# Patient Record
Sex: Male | Born: 1975 | Race: White | Hispanic: No | Marital: Married | Smoking: Former smoker
Health system: Southern US, Community
[De-identification: ages and names within clinical notes are randomized; demographics above are authoritative.]

## PROBLEM LIST (undated history)

## (undated) DIAGNOSIS — J3081 Allergic rhinitis due to animal (cat) (dog) hair and dander: Secondary | ICD-10-CM

## (undated) DIAGNOSIS — Z8711 Personal history of peptic ulcer disease: Secondary | ICD-10-CM

## (undated) DIAGNOSIS — S62319A Displaced fracture of base of unspecified metacarpal bone, initial encounter for closed fracture: Secondary | ICD-10-CM

## (undated) DIAGNOSIS — L4050 Arthropathic psoriasis, unspecified: Secondary | ICD-10-CM

## (undated) DIAGNOSIS — Z8719 Personal history of other diseases of the digestive system: Secondary | ICD-10-CM

## (undated) DIAGNOSIS — M797 Fibromyalgia: Secondary | ICD-10-CM

## (undated) HISTORY — PX: ORIF METACARPAL FRACTURE: SUR940

---

## 2010-06-08 ENCOUNTER — Emergency Department (HOSPITAL_COMMUNITY): Admission: EM | Admit: 2010-06-08 | Discharge: 2010-06-08 | Payer: Self-pay | Admitting: Emergency Medicine

## 2010-08-01 ENCOUNTER — Emergency Department (HOSPITAL_COMMUNITY): Admission: EM | Admit: 2010-08-01 | Discharge: 2010-08-01 | Payer: Self-pay | Admitting: Emergency Medicine

## 2012-12-08 ENCOUNTER — Emergency Department: Payer: Self-pay | Admitting: Unknown Physician Specialty

## 2012-12-08 LAB — DRUG SCREEN, URINE
Barbiturates, Ur Screen: NEGATIVE (ref ?–200)
Cannabinoid 50 Ng, Ur ~~LOC~~: POSITIVE (ref ?–50)
Cocaine Metabolite,Ur ~~LOC~~: POSITIVE (ref ?–300)
Methadone, Ur Screen: NEGATIVE (ref ?–300)
Phencyclidine (PCP) Ur S: NEGATIVE (ref ?–25)
Tricyclic, Ur Screen: NEGATIVE (ref ?–1000)

## 2012-12-08 LAB — CBC
HGB: 14.8 g/dL (ref 13.0–18.0)
MCH: 31.6 pg (ref 26.0–34.0)
MCHC: 34.8 g/dL (ref 32.0–36.0)
MCV: 91 fL (ref 80–100)
RDW: 13 % (ref 11.5–14.5)

## 2012-12-08 LAB — COMPREHENSIVE METABOLIC PANEL
Albumin: 4 g/dL (ref 3.4–5.0)
BUN: 22 mg/dL — ABNORMAL HIGH (ref 7–18)
Calcium, Total: 9.1 mg/dL (ref 8.5–10.1)
Co2: 23 mmol/L (ref 21–32)
Creatinine: 1.21 mg/dL (ref 0.60–1.30)
EGFR (African American): >60
EGFR (Non-African Amer.): >60
Glucose: 89 mg/dL (ref 65–99)
Osmolality: 275 (ref 275–301)
Potassium: 4 mmol/L (ref 3.5–5.1)
SGOT(AST): 40 U/L — ABNORMAL HIGH (ref 15–37)
Sodium: 136 mmol/L (ref 136–145)
Total Protein: 7.7 g/dL (ref 6.4–8.2)

## 2012-12-08 LAB — ETHANOL: Ethanol: <3 mg/dL

## 2015-08-19 ENCOUNTER — Emergency Department (HOSPITAL_COMMUNITY)
Admission: EM | Admit: 2015-08-19 | Discharge: 2015-08-19 | Disposition: A | Discharge status: Home / Self Care | Payer: Commercial Managed Care - PPO | Attending: Emergency Medicine | Admitting: Emergency Medicine

## 2015-08-19 ENCOUNTER — Encounter (HOSPITAL_COMMUNITY): Payer: Self-pay | Admitting: *Deleted

## 2015-08-19 DIAGNOSIS — Z8739 Personal history of other diseases of the musculoskeletal system and connective tissue: Secondary | ICD-10-CM | POA: Insufficient documentation

## 2015-08-19 DIAGNOSIS — Z880 Allergy status to penicillin: Secondary | ICD-10-CM | POA: Diagnosis not present

## 2015-08-19 DIAGNOSIS — J45901 Unspecified asthma with (acute) exacerbation: Secondary | ICD-10-CM | POA: Diagnosis not present

## 2015-08-19 DIAGNOSIS — R062 Wheezing: Secondary | ICD-10-CM | POA: Diagnosis present

## 2015-08-19 HISTORY — DX: Fibromyalgia: M79.7

## 2015-08-19 MED ORDER — ALBUTEROL SULFATE (2.5 MG/3ML) 0.083% IN NEBU
5.0000 mg | INHALATION_SOLUTION | Freq: Once | RESPIRATORY_TRACT | Status: AC
  Administered 2015-08-19: 5 mg via RESPIRATORY_TRACT
  Filled 2015-08-19: qty 6

## 2015-08-19 MED ORDER — IPRATROPIUM BROMIDE 0.02 % IN SOLN
0.5000 mg | Freq: Once | RESPIRATORY_TRACT | Status: AC
  Administered 2015-08-19: 0.5 mg via RESPIRATORY_TRACT
  Filled 2015-08-19: qty 2.5

## 2015-08-19 MED ORDER — ALBUTEROL SULFATE HFA 108 (90 BASE) MCG/ACT IN AERS: 1.0000 | INHALATION_SPRAY | Freq: Four times a day (QID) | RESPIRATORY_TRACT | Status: DC | PRN

## 2015-08-19 MED ORDER — PREDNISONE 20 MG PO TABS: 40.0000 mg | ORAL_TABLET | Freq: Every day | ORAL | Status: DC

## 2015-08-19 MED ORDER — DEXAMETHASONE SODIUM PHOSPHATE 10 MG/ML IJ SOLN
10.0000 mg | Freq: Once | INTRAMUSCULAR | Status: AC
Start: 2015-08-19 — End: 2015-08-19
  Administered 2015-08-19: 10 mg via INTRAMUSCULAR
  Filled 2015-08-19: qty 1

## 2015-08-19 NOTE — ED Provider Notes (Signed)
History  This chart was scribed for non-physician practitioner, Arthor Captain, PA-C,working with Richardean Canal, MD, by Karle Plumber, ED Scribe. This patient was seen in room TR08C/TR08C and the patient's care was started at 9:38 AM.  Chief Complaint  Patient presents with  . Asthma  . Wheezing  . Shortness of Breath   The history is provided by the patient and medical records. No language interpreter was used.    HPI Comments:  Tim Jackson is a 39 y.o. obese male who presents to the Emergency Department complaining of an asthma exacerbation that started about one weeks ago. He reports associated wheezing and SOB. 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 He states he called EMS a few days ago and received a nebulizer treatment that helped at the time. He denies any past intubations for his asthma. He reports a short hospitalization 11 years ago for an asthma related illness. He denies fever, chills, nausea or vomiting.  Past Medical History  Diagnosis Date  . Asthma   . Fibromyalgia    Past Surgical History  Procedure Laterality Date  . Hand surgery     No family history on file. Social History  Substance Use Topics  . Smoking status: Never Smoker   . Smokeless tobacco: None  . Alcohol Use: No    Review of Systems A complete 10 system review of systems was obtained and all systems are negative except as noted in the HPI and PMH.   Allergies  Fish allergy and Penicillins  Home Medications   Prior to Admission medications   Not on File   Triage Vitals: BP 102/48 mmHg  Pulse 52  Temp(Src) 98.4 F (36.9 C) (Oral)  Resp 20  Ht  (1.854 m)  Wt 270 lb (122.471 kg)  BMI 35.63 kg/m2  SpO2 99% Physical Exam  Constitutional: He is oriented to person, place, and time. He appears well-developed and well-nourished.  HENT:  Head: Normocephalic and atraumatic.  Eyes: EOM are normal.  Neck: Normal  range of motion.  Cardiovascular: Normal rate.   Pulmonary/Chest: Effort normal. He has wheezes (minimal inspiratory wheezes in left upper lobe).  Musculoskeletal: Normal range of motion.  Neurological: He is alert and oriented to person, place, and time.  Skin: Skin is warm and dry.  Psychiatric: He has a normal mood and affect. His behavior is normal.  Nursing note and vitals reviewed.   ED Course  Procedures (including critical care time) DIAGNOSTIC STUDIES: Oxygen Saturation is 99% on RA, normal by my interpretation.   COORDINATION OF CARE: 9:42 AM- Will prescribe albuterol MDI and Prednisone dose pack. Pt verbalizes understanding and agrees to plan.  Medications  albuterol (PROVENTIL) (2.5 MG/3ML) 0.083% nebulizer solution 5 mg (5 mg Nebulization Given 08/19/15 0841)  ipratropium (ATROVENT) nebulizer solution 0.5 mg (0.5 mg Nebulization Given 08/19/15 0841)  dexamethasone (DECADRON) injection 10 mg (10 mg Intramuscular Given 08/19/15 0845)    MDM   Final diagnoses:  Asthma exacerbation    Patient ambulated in ED with O2 saturations maintained >90, no current signs of respiratory distress. Lung exam improved after nebulizer treatment. Prednisone given in the ED and pt will bd dc with 5 day burst. Pt states they are breathing at baseline. Pt has been instructed to continue using prescribed medications and to speak with PCP about today's exacerbation.    I personally performed the services described in this documentation, which was scribed  in my presence. The recorded information has been reviewed and is accurate.      Arthor Captain, PA-C 08/21/15 1635  Richardean Canal, MD 08/22/15 1556

## 2015-08-19 NOTE — ED Notes (Signed)
Patient with hx of asthma.  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  He received a neb treatment and felt better.  His sx have worsened again over the past 2 days.  Patient does not have an inhaler at home.  Patient denies fever.  Patient states he was able to sleep but woke with sob.

## 2015-08-19 NOTE — ED Notes (Signed)
Oximetry maintained at 96-98% while ambulating on room air.

## 2015-08-19 NOTE — Discharge Instructions (Signed)

## 2015-08-23 ENCOUNTER — Telehealth: Payer: Self-pay | Admitting: Internal Medicine

## 2015-08-23 NOTE — Telephone Encounter (Signed)
Call to patient to confirm appointment for 08/24/15 at 2:15 lmtcb

## 2015-08-24 ENCOUNTER — Ambulatory Visit (INDEPENDENT_AMBULATORY_CARE_PROVIDER_SITE_OTHER): Payer: Commercial Managed Care - PPO | Admitting: Internal Medicine

## 2015-08-24 ENCOUNTER — Ambulatory Visit: Payer: Commercial Managed Care - PPO | Admitting: Internal Medicine

## 2015-08-24 ENCOUNTER — Encounter: Payer: Self-pay | Admitting: Internal Medicine

## 2015-08-24 VITALS — BP 129/75 | HR 86 | Temp 98.3°F | Ht 73.0 in | Wt 280.4 lb

## 2015-08-24 DIAGNOSIS — J45901 Unspecified asthma with (acute) exacerbation: Secondary | ICD-10-CM

## 2015-08-24 DIAGNOSIS — R21 Rash and other nonspecific skin eruption: Secondary | ICD-10-CM | POA: Diagnosis not present

## 2015-08-24 DIAGNOSIS — M797 Fibromyalgia: Secondary | ICD-10-CM

## 2015-08-24 DIAGNOSIS — Z0000 Encounter for general adult medical examination without abnormal findings: Secondary | ICD-10-CM

## 2015-08-24 DIAGNOSIS — R197 Diarrhea, unspecified: Secondary | ICD-10-CM

## 2015-08-24 DIAGNOSIS — R350 Frequency of micturition: Secondary | ICD-10-CM | POA: Diagnosis not present

## 2015-08-24 DIAGNOSIS — L409 Psoriasis, unspecified: Secondary | ICD-10-CM | POA: Insufficient documentation

## 2015-08-24 DIAGNOSIS — J4521 Mild intermittent asthma with (acute) exacerbation: Secondary | ICD-10-CM

## 2015-08-24 DIAGNOSIS — M2550 Pain in unspecified joint: Secondary | ICD-10-CM | POA: Insufficient documentation

## 2015-08-24 DIAGNOSIS — J4540 Moderate persistent asthma, uncomplicated: Secondary | ICD-10-CM | POA: Insufficient documentation

## 2015-08-24 MED ORDER — DULOXETINE HCL 30 MG PO CPEP: 30.0000 mg | ORAL_CAPSULE | Freq: Every day | ORAL | Status: DC

## 2015-08-24 NOTE — Assessment & Plan Note (Signed)
Patient has complaint of chronic diarrhea for about 7 years now. He describes loose, water stools that are rarely solid. He associates the diarrhea with meals, but denies association with dairy products. He describes previous dark, tarry stools about 5 months ago when diagnosed with PUD per symptoms. He was started on trial of PPI, but did not take as his symptoms resolved. He was told to avoid NSAIDs and has done so. He denies abdominal pain, hematochezia, or current melena. Denies recent travel, camping/hiking near water sources, or sick contacts. On exam, bowel sounds are normal, abdomen non-tender, no guarding or rebound.   His symptoms possibly due to IBS, have to also consider lactose intolerance, celiac's, parasite/ova, or immunocompromised state. Due to history of melena, may need upper endoscopy for further evaluation.  -CBC -CMP -HIV antibody -Referral to GI, Dr. Ewing Schlein was requested by patient and wife -Patient advised that he can try probiotics

## 2015-08-24 NOTE — Assessment & Plan Note (Signed)
Patient describes urinary frequency that keeps him from sleeping well at night, having to get up 3-4 times nightly and throughout the day. He endorses urgency, occasional dysuria/burning, occasional difficulty initiating flow, and cloudy color to the urine. He denies any hematuria. He drinks fluids throughout the day. He denies fever, chills, abdominal or flank pain, or suprapubic tenderness.  Will evaluate for possible UTI -UA w/ reflex microscopy

## 2015-08-24 NOTE — Patient Instructions (Signed)
It was a pleasure to meet you Mr. Tim Jackson.  We will check a few blood tests today as well as a urine test.  For your fibromyalgia, I have sent a prescription of Cymbalta (duloxetine) to your pharmacy. It is a 30 mg tablet you can take everyday with breakfast. It may take a few weeks for you to notice any effect, and we can slowly increase the dose as needed.  I have sent a referral to GI Dr. Ewing Schlein as well as to dermatology.  You may try probiotics for your diarrhea.  Please follow up with [place] in 2-4 weeks.

## 2015-08-24 NOTE — Progress Notes (Signed)
Patient ID: Tim Jackson, male   DOB: 01-31-1976, 39 y.o.   MRN: [id number]   Subjective:   Patient ID: Tim Jackson male   DOB: 03-18-1976 39 y.o.   MRN: [id number]  HPI: [name] is a 39 y.o. male with PMH of asthma and fibromyalgia who presents as a new patient to clinic for establishment of care and current complaints of urinary frequency, diarrhea, and muscle aches and pain. He is accompanied by his wife who provides additional history.  Please see problem based charting for details about chronic medical issues.  Past Medical History  Diagnosis Date  . Asthma   . Fibromyalgia    Current Outpatient Prescriptions  Medication Sig Dispense Refill  . albuterol (PROVENTIL HFA;VENTOLIN HFA) 108 (90 BASE) MCG/ACT inhaler Inhale 1-2 puffs into the lungs every 6 (six) hours as needed for wheezing or shortness of breath. 1 Inhaler 0  . predniSONE (DELTASONE) 20 MG tablet Take 2 tablets (40 mg total) by mouth daily. 10 tablet 0  . DULoxetine (CYMBALTA) 30 MG capsule Take 1 capsule (30 mg total) by mouth daily with breakfast. 30 capsule 0   No current facility-administered medications for this visit.   Family History  Problem Relation Age of Onset  . Cancer Mother   . Heart disease Father   . Cancer Maternal Grandfather   . Cancer Paternal Grandfather    Social History   Social History  . Marital Status: Divorced    Spouse Name: N/A  . Number of Children: N/A  . Years of Education: N/A   Social History Main Topics  . Smoking status: Former Smoker -- 0.50 packs/day for 2 years  . Smokeless tobacco: None  . Alcohol Use: No  . Drug Use: No  . Sexual Activity: Not Asked   Other Topics Concern  . None   Social History Narrative   Review of Systems: Review of Systems  Constitutional: Positive for malaise/fatigue. Negative for fever, chills, weight loss and diaphoresis.  HENT: Negative for sore throat.   Respiratory: Negative for cough, shortness of breath, wheezing and  stridor.   Cardiovascular: Negative for chest pain and palpitations.  Gastrointestinal: Positive for diarrhea. Negative for heartburn, nausea, vomiting, abdominal pain, constipation and blood in stool.       Previous melena, not currently.  Genitourinary: Positive for dysuria, urgency and frequency. Negative for hematuria and flank pain.  Musculoskeletal: Positive for myalgias. Negative for back pain, joint pain, falls and neck pain.  Skin:       Umbilical rash with occasional itching.  Neurological: Positive for tingling and weakness. Negative for dizziness, sensory change and headaches.    Objective:  Physical Exam: Filed Vitals:   08/24/15 1414  BP: 129/75  Pulse: 86  Temp: 98.3 F (36.8 C)  TempSrc: Oral  Height:  (1.854 m)  Weight: 280 lb 6.4 oz (127.189 kg)  SpO2: 95%   Physical Exam  Constitutional: He is oriented to person, place, and time. He appears well-developed and well-nourished.  HENT:  Head: Normocephalic and atraumatic.  Mouth/Throat: Oropharynx is clear and moist.  Eyes: EOM are normal. Pupils are equal, round, and reactive to light.  Neck: Neck supple.  Cardiovascular: Normal rate, regular rhythm and normal heart sounds.  Exam reveals no friction rub.   No murmur heard. Pulmonary/Chest: Effort normal and breath sounds normal. No respiratory distress. He has no wheezes. He has no rales.  Abdominal: Soft. Bowel sounds are normal. There is no tenderness.  Musculoskeletal: Normal range of  motion. He exhibits no edema.  Various spots of tenderness to palpation of muscles, 5/18 points of tenderness on fibromyalgia exam.  Neurological: He is alert and oriented to person, place, and time. He has normal strength. He displays normal reflexes. No sensory deficit.  Skin:  Erythematous/pink blanching umbilical rash that circles the navel up to 1.5 cm outwards. Dry, nonbleeding, no oozing or pus. Nontender.    Assessment & Plan:  Please see problem based  charting.

## 2015-08-24 NOTE — Assessment & Plan Note (Signed)
Patient recently seen in ED on 08/19/2015 for acute asthma exacerbation. 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 Patient states that this is his first exacerbation in at least 10 years and has not had to use his albuterol inhaler for that timespan. He has used his inhaler once since leaving the ED, but has no current complaints of wheezing, SOB, or DOE. Lungs are CTA on exam.  Had PFTs in 03/2013 with FEV1 of 61% and FEV1/FVC of 71%.  -Continue Albuterol inhaler prn

## 2015-08-24 NOTE — Assessment & Plan Note (Signed)
Patient with rash around the umbilicus that has been present for 12-13 years. It is not painful, but occasionally pruritic. It is erythematous/pink in color, blanching, maculopapular and located in the umbilicus with borders extending 1.5 cm outwards in circular fashion around the navel. It is non-tender to touch, dry, non-bleeding, no oozing or pus. He reports trying antifungal and steroid creams in the past with no improvement. Does not associate rash with clothes, soaps, detergents, or lotions.  -Will refer to dermatology

## 2015-08-24 NOTE — Assessment & Plan Note (Signed)
Refused influenza vaccine.

## 2015-08-24 NOTE — Progress Notes (Signed)
39 year old male diagnosed with fibromyalgia from before (Care Everywhere), on prednisone currently for asthma exacerbation visits this clinic for the first time. Current concerns:  Fibromyalgia - Amitriptyline tried in the past caused shortness of breath. We will start the patient on low dose Cymbalta. Revisit in 4 weeks.  Rash around umbilicus - Chronic, patient has tried all different kinds of antifungal and steroids. Refer to dermatology for biopsy.  Chronic diarrhea, only during daytime, associated with food intake, likely IBS. Also history of UGIB in April, no black tarry stools now. No symptoms now, does not want PPI currently. Refer to GI for further work up.  Does not want flu shot.  Case seen and examined with Dr Terrilee Croak. Plan discussed.  Aletta Edouard MD MPH 08/24/2015 3:55 PM

## 2015-08-24 NOTE — Assessment & Plan Note (Addendum)
Patient reports diagnosis of fibromyalgia at [facility] about 3 years ago. He has symptoms of muscle aches that are dull in sensation and described as heavy, tight feelings with occasional spasms and "locked-up" muscles. His symptoms are worse during the day and when he is resting after work. He describes feeling fatigued and needing to force himself to get up and get his daily activities done. He also reports loss of muscle mass even though he is physically active, especially at work. Denies joint pain, fevers, chills, nausea, vomiting, or constipation. He does endorse chronic diarrhea. He has several spots of tenderness to palpation (5/18 for fibromyalgia exam), ROM, strength, and DTRs are intact.  I reviewed most of workup done at [facility] with only results that stood out as abnormal of positive ANA, slightly elevated Aldolase 11.2, and borderline normal B12 of 300. ESR was 4 and CRP/CK levels were WNL as well. He reports trying both Amitriptyline and Gabapentin before, neither of which he could tolerate due to difficulty breathing and hypotension/feeling unwell, respectively. It is possible he has fibromyalgia, have to consider other myopathies/myositis as well.  -Will try Duloxetine (cymbalta) 30 mg po daily with breakfast. Patient advised it may take several weeks to notice effects. -f/u in 2-4 weeks

## 2015-08-25 LAB — CBC
HEMOGLOBIN: 14.8 g/dL (ref 12.6–17.7)
Hematocrit: 44.7 % (ref 37.5–51.0)
MCH: 30.1 pg (ref 26.6–33.0)
MCHC: 33.1 g/dL (ref 31.5–35.7)
MCV: 91 fL (ref 79–97)
Platelets: 348 10*3/uL (ref 150–379)
RBC: 4.92 x10E6/uL (ref 4.14–5.80)
RDW: 13.6 % (ref 12.3–15.4)
WBC: 10.3 10*3/uL (ref 3.4–10.8)

## 2015-08-25 LAB — CMP14 + ANION GAP
A/G RATIO: 1.7 (ref 1.1–2.5)
ALT: 29 IU/L (ref 0–44)
AST: 20 IU/L (ref 0–40)
Albumin: 4.5 g/dL (ref 3.5–5.5)
Alkaline Phosphatase: 92 IU/L (ref 39–117)
Anion Gap: 18 mmol/L (ref 10.0–18.0)
BILIRUBIN TOTAL: 0.9 mg/dL (ref 0.0–1.2)
BUN/Creatinine Ratio: 17 (ref 8–19)
BUN: 20 mg/dL (ref 6–20)
CHLORIDE: 101 mmol/L (ref 97–108)
CO2: 23 mmol/L (ref 18–29)
Calcium: 10 mg/dL (ref 8.7–10.2)
Creatinine, Ser: 1.21 mg/dL (ref 0.76–1.27)
GFR calc non Af Amer: 75 mL/min/{1.73_m2} (ref >59)
GFR, EST AFRICAN AMERICAN: 87 mL/min/{1.73_m2} (ref >59)
GLUCOSE: 138 mg/dL — AB (ref 65–99)
Globulin, Total: 2.7 g/dL (ref 1.5–4.5)
POTASSIUM: 5.3 mmol/L — AB (ref 3.5–5.2)
Sodium: 142 mmol/L (ref 134–144)
TOTAL PROTEIN: 7.2 g/dL (ref 6.0–8.5)

## 2015-08-25 LAB — HIV ANTIBODY (ROUTINE TESTING W REFLEX): HIV SCREEN 4TH GENERATION: NONREACTIVE

## 2015-08-25 LAB — URINALYSIS, ROUTINE W REFLEX MICROSCOPIC
Bilirubin, UA: NEGATIVE
GLUCOSE, UA: NEGATIVE
KETONES UA: NEGATIVE
LEUKOCYTES UA: NEGATIVE
NITRITE UA: NEGATIVE
Protein, UA: NEGATIVE
RBC, UA: NEGATIVE
SPEC GRAV UA: 1.025 (ref 1.005–1.030)
Urobilinogen, Ur: 0.2 mg/dL (ref 0.2–1.0)
pH, UA: 5.5 (ref 5.0–7.5)

## 2015-09-25 ENCOUNTER — Ambulatory Visit: Payer: Commercial Managed Care - PPO | Admitting: Internal Medicine

## 2015-10-02 ENCOUNTER — Ambulatory Visit (INDEPENDENT_AMBULATORY_CARE_PROVIDER_SITE_OTHER): Payer: Commercial Managed Care - PPO | Admitting: Internal Medicine

## 2015-10-02 ENCOUNTER — Encounter: Payer: Self-pay | Admitting: Internal Medicine

## 2015-10-02 VITALS — BP 132/78 | HR 100 | Temp 98.0°F | Ht 73.0 in | Wt 280.8 lb

## 2015-10-02 DIAGNOSIS — R197 Diarrhea, unspecified: Secondary | ICD-10-CM

## 2015-10-02 DIAGNOSIS — M791 Myalgia, unspecified site: Secondary | ICD-10-CM

## 2015-10-02 DIAGNOSIS — R21 Rash and other nonspecific skin eruption: Secondary | ICD-10-CM

## 2015-10-02 DIAGNOSIS — M797 Fibromyalgia: Secondary | ICD-10-CM

## 2015-10-02 MED ORDER — DULOXETINE HCL 60 MG PO CPEP: 60.0000 mg | ORAL_CAPSULE | Freq: Every day | ORAL | Status: DC

## 2015-10-02 MED ORDER — PANTOPRAZOLE SODIUM 40 MG PO TBEC: 40.0000 mg | DELAYED_RELEASE_TABLET | Freq: Every day | ORAL | Status: DC

## 2015-10-02 NOTE — Patient Instructions (Signed)
- Start taking Duloxetine 60 mg daily - Labs today - try a gluten free diet and see if your symptoms improve  Gluten-Free Diet for Celiac Disease Gluten is a protein found in wheat, rye, barley, and triticale (a cross between wheat and rye) grains. People with celiac disease need to have a gluten-free diet. With celiac disease, gluten interferes with the absorption of food and may also cause intestinal injury.  Strict compliance is important even during symptom-free periods. This means eliminating all foods with gluten from your diet permanently. This requires some significant changes but is very manageable. WHAT DO I NEED TO KNOW ABOUT A GLUTEN-FREE DIET?  Look for items labeled with "GF." Looking for GF will make it easier to identify products that are safe to eat.  Read all labels. Gluten may have been added as a minor ingredient where least expected, such as in shredded cheeses or ice creams. Always check food labels and investigate questionable ingredients. Talk to your dietitian or health care provider if you have questions about certain foods or need help finding GF foods.  Check when in doubt. If you are not sure whether an ingredient contains gluten, check with the manufacturer. Note that some manufacturers may change ingredients without notice. Always read labels.   Know how food is prepared. Since flour and cereal products are often used in the preparation of foods, it is important to be aware of the methods of preparation used, as well as the ingredients in the foods themselves. This is especially true when you are dining out. Ask restaurants if they have a gluten-free menu.  Watch for cross-contamination. Cross-contamination occurs when gluten-free foods come into contact with foods that contain gluten. It often happens during the manufacturing process. Always check the ingredient list and for warnings on packages, such as "may contain gluten."  Eat a balanced diet. It is important  to still get enough fiber, iron, and B vitamins in your diet. Look for enriched whole grain gluten-free products and continue to eat a well-balanced diet of the important non-grain items, such as vegetables, fruit, lean proteins, legumes, and dairy.  Consider taking a gluten-free multivitamin and mineral supplement. Discuss this with your health care provider. WHAT KEY WORDS HELP IDENTIFY GLUTEN? Know key words to help identify gluten. A dietitian can help you identify possible harmful ingredients in the foods you normally eat. Words to check for on food labels include:   Flour, enriched flour, bromated flour, white flour, durum flour, graham flour, phosphated flour, self-rising flour, semolina, or farina.  Starch, dextrin, modified food starch, or cereal.  Thickening, fillers, or emulsifiers.  Any kind of malt flavoring, extract, or syrup (malt is made from barley and includes malt vinegar, malted milk, and malted beverages).  Hydrolyzed vegetable protein. WHAT FOODS CAN I EAT? Below is a list of common foods that are allowed with a gluten-free diet.  Grains Products made from the following flours or grains:amaranth,bean flours, 100% buckwheat flour, corn, millet, nut flours or meals, GF oats, quinoa, rice, sorghum, teff, any all-purpose 100% GF flour mix, rice wafers, pure cornmeal tortillas, popcorn, some crackers, some chips, and hot cereals made from cornmeal. Ask your dietitian which specific hot and cold cereals are allowed. Hominy, rice or wild rice, and special GF pasta. Some Asian rice noodles or bean noodles. Arrowroot starch, corn bran, corn flour, corn germ, cornmeal, corn starch, potato flour, potato starch flour, and rice bran. Rice flours: plain, brown, and sweet. Rice polish, soy flour, tapioca starch.  Vegetables All plain, fresh, frozen, or canned vegetables.  Fruits All fresh, frozen, canned, dried fruits, and fruit juices.  Meats and Other Protein Foods Meat, fish,  poultry, or eggs prepared without added wheat, rye, barley, or triticale. Some luncheon meat and some frankfurters. Pure meat. All aged cheese, most processed cheese products, some cottage cheese, and some cream cheese. Dried beans, dried peas, and lentils.  Dairy Milk and yogurt made with allowed ingredients.  Beverages Coffee (regular or decaffeinated), tea, herbal tea (read label to be sure that no wheat flour has been added). Carbonated beverages and some root beers. Wine, sake, and distilled spirits, such as gin, vodka, and whiskey. GF beers and GF ciders.  Sweetsand Desserts Sugar, honey, some syrups, molasses, jelly, jam, plain hard candy, marshmallows, gumdrops, homemade candies free of wheat, rye, barley, or triticale. Coconut. Custard, some pudding mixes, and homemade puddings from cornstarch, rice, and tapioca. Gelatin desserts, sorbets, frozen ice pops, and sherbet. Cake, cookies, and other desserts prepared with allowed flours. Some commercial ice creams. Ask your dietitian about specific brands of dessert that are allowed.  Fats and Oils Butter, margarine, vegetable oil, sour cream not containing modified food starch, whipping cream, shortening, lard, cream, and some mayonnaise. Some commercial salad dressings. Peanut butter.  Other Homemade broth and soups made with allowed ingredients; some canned or frozen soups. Any other combination or prepared foods that do not contain gluten. Monosodium glutamate (MSG). Cider, rice, and wine vinegar. Baking soda and baking powder. Certain soy sauces (Tamari). Ask your dietitian about specific brands that are allowed. Nuts, coconut, chocolate, and pure cocoa powder. Salt, pepper, herbs, spices, extracts, and food colorings. The items listed above may not be a complete list of allowed foods or beverages. Contact your dietitian for more options.  WHAT FOODS CAN I NOT EAT? Below is a list of common foods that are not allowed with a gluten-free  diet.  Grains Barley, bran, bulgur, cracked wheat, graham, malt, matzo, wheat germ, and all wheat and rye cereals including spelt and kamut. Avoid cereals containing malt as a flavoring, such as rice cereal. Also avoid regular noodles, spaghetti, macaroni, and most packaged rice mixes, and all others containing wheat, rye, barley, or triticale.  Vegetables Most creamed vegetables, most vegetables canned in sauces, and any vegetables prepared with wheat, rye, barley, or triticale.  Fruits Thickened or prepared fruits and some pie fillings.  Meats and Other Protein Sources Any meat or meat alternative containing wheat, rye, barley, or gluten stabilizers (such as some hot dogs, salami, cold cuts, or sausage). Bread-containing products, such as Swiss steak, croquettes, and meatloaf. Most tuna canned in vegetable broth, [place] with hydrolyzed vegetable protein (HVP) injected as part of the basting, and any cheese product containing oat gum as an ingredient. [place]itan. Imitation fish. Dairy Commercial chocolate milk, which may have cereal added, and malted milk. Beverages Certain cereal beverages. Beer and ciders (unless GF), ale, malted milk, and some root beers. Sweetsand Desserts Commercial candies containing wheat, rye, barley, or triticale. Certain toffees are dusted with wheat flour. Chocolate-coated nuts, which are often rolled in flour. Cakes, cookies, doughnuts, and pastries that are prepared with wheat, barley, rye, or triticale flour. Some commercial ice creams, ice cream flavors which contain cookies, crumbs, or cheesecake. Ice cream cones. Commercially prepared mixes for cakes, cookies, and other desserts unless marked GF. Bread pudding and other puddings thickened with flour. Fats and Oils Some commercial salad dressings and sour cream containing modified food starch.  Condiments Some  curry powder, some dry seasoning mixes, some gravy extracts, some meat sauces, some ketchup, some  prepared mustard, horseradish. Other All soups containing wheat, rye, barley, or triticale flour. Bouillon and bouillon cubes that contain HVP. Combination or prepared foods that contain gluten. Some soy sauce, some chip dips, and some chewing gum. Yeast extract (contains barley). Caramel color (may contain malt). The items listed above may not be a complete list of foods and beverages to avoid. Contact your dietitian for more information.   This information is not intended to replace advice given to you by your health care provider. Make sure you discuss any questions you have with your health care provider.   Document Released: 11/24/2005 Document Revised: 12/15/2014 Document Reviewed: 09/28/2013 Elsevier Interactive Patient Education 2016 [employer].   General Instructions:   Please bring your medicines with you each time you come to clinic.  Medicines may include prescription medications, over-the-counter medications, herbal remedies, eye drops, vitamins, or other pills.   Progress Toward Treatment Goals:  No flowsheet data found.  Self Care Goals & Plans:  No flowsheet data found.  No flowsheet data found.   Care Management & Community Referrals:  No flowsheet data found.

## 2015-10-02 NOTE — Progress Notes (Signed)
   Subjective:    Patient ID: Tim Jackson, male    DOB: Apr 23, 1976, 39 y.o.   MRN: [id number]  HPI Mr. Kerrigan is a 39yo man with PMHx of asthma and fibromyalgia who presents today for follow up of his diarrhea and diffuse muscle and joint pains.   Back in 2010 he developed a number of symptoms, including bladder dysfunction (frequent urination), pedal paresthesias, fatigue, numbness/paresthesias in his upper extremities, chest pain, dyspnea, PUD, weight loss, heat/cold intolerance, and weakness. He was initially evaluated at [facility] and given presumptive diagnosis of fibromyalgia vs chronic pain syndrome after work up was non-revealing. He had an MRI brain in Jan 2014 that was unremarkable. He did have a positive ANA and mildly elevated aldolase (11.2) in Nov 2013. Rheumatoid factor was negative at that time. TSH levels have been normal. ESR and CRP normal.   He was evaluated in our clinic on 9/16 and started on Cymbalta to help with his muscle aches. He states he had not had any relief since starting Cymbalta. He reports he occasionally missed doses but did complete the 30 day course. He describes burning pain in his feet and "whole body aches." He notes pains in several joints, including shoulders, knees, back, and elbows. He states his muscle aches and joint pains get worse throughout the day. He has tried lyrica, gabapentin, amitriptyline, and tramadol in the past and has not tolerated these medications well.   He reports his chronic diarrhea is worsening. He reports having to use the restroom multiple times throughout the day (>10). He reports he had a tarry stool about 3 days ago. He denies any frank red blood. He does have a history of a "bleeding ulcer." He describes having reflux symptoms such as burning epigastric pain and dry cough. He denies fevers, chills, nausea, vomiting.   He reports he went to the dermatologist for evaluation of his rash around his umbilicus. He believes the dermatologist  told him it could be psoriasis. He had a biopsy done and is awaiting results.     Review of Systems General: Reports poor appetite due to frequent diarrhea. Denies night sweats, changes in weight HEENT: Reports headaches- occasional. Denies ear pain, changes in vision, rhinorrhea, sore throat CV: Denies CP, palpitations, SOB, orthopnea Pulm: Denies SOB, wheezing GI: See HPI GU: Denies dysuria, hematuria Msk: See HPI Neuro: See HPI Skin: Reports rash around his umbilicus- has been present since 2004. Denies bruising. Psych: Denies depression, anxiety, hallucinations    Objective:   Physical Exam General: alert, sitting up, NAD HEENT: [place]/AT, EOMI, PERRL, sclera anicteric, mucus membranes moist Neck: supple, no lymphadenopathy or thyromegaly CV: RRR, no m/g/r Pulm: CTA bilaterally, breaths non-labored, no wheezing Abd: BS+, soft, obese, non-tender Ext: warm, no peripheral edema. Diffuse tenderness to palpation of all extremities.  Neuro: alert and oriented x 3. Strength 5/5 in upper and lower extremities bilaterally.  Skin: There is an erythematous rash surrounding his umbilicus. Small vesicle-like appearing lesions are present. No scaling. No tenderness to palpation or drainage. No rashes or skin lesions present in other areas.     Assessment & Plan:  Please refer to A&P documentation.

## 2015-10-03 LAB — C-REACTIVE PROTEIN: CRP: 2.5 mg/L (ref 0.0–4.9)

## 2015-10-03 LAB — RHEUMATOID FACTOR

## 2015-10-03 LAB — SEDIMENTATION RATE: SED RATE: 7 mm/h (ref 0–15)

## 2015-10-03 NOTE — Assessment & Plan Note (Signed)
Hx of chronic diarrhea for at least last 7 years. I am suspicious for celiac disease given his associated joint pains and chronic umbilical rash as well. Will do autoimmune work up but may want to consider evaluating his stool and checking for chronic infections if work up negative. His HIV was negative last visit. May want to also consider colonoscopy in future as he had a tarry stool 3 days ago. FOBT negative today. - Check ESR, CRP, ANA, RF, CCP, MPO/PR3 antibodies - Check transglutaminase - Advised to try gluten free diet - Consider evaluating stool next visit  - Can also consider checking TSH

## 2015-10-03 NOTE — Assessment & Plan Note (Addendum)
Patient with previous diagnosis of fibromyalgia but this was given as a diagnosis of exclusion. His symptoms do not seem consistent with fibromyalgia. He has diffuse muscle aches "all over" and joint pains in his knees, shoulders, back, and elbows. Additionally with his hx of chronic diarrhea and umbilical rash I am concerned for more of an autoimmune/inflammatory process vs chronic infection. Differential includes IBD (Crohn's/UC), celiac disease, microscopic colitis, malabsorption syndrome, or chronic infection (C. difficile, Aeromonas, Plesiomonas, Campylobacter, Giardia, Amebae, Cryptosporidium). His HIV was negative at last visit. He reported having 1 tarry stool about 3 days ago. His Hgb was stable at last visit at 14.8. I did an FOBT and it was negative. Will do autoimmune work up. I am most suspicious for celiac disease. - Check ESR, CRP, ANA, RF, CCP, MPO/PR3 antibodies - Check transglutaminase  - Patient instructed to try gluten free diet for a few weeks to see if he notices improvement in his symptoms. Information and education provided on foods to avoid for a gluten free diet. - Increase Cymbalta to 60 mg daily - May want to consider getting a stool sample next visit - Also consider colonoscopy given recent bleeding- he does have a hx of PUD. I have placed him on a PPI- Protonix 40 mg daily - Can consider checking aldolase and CK levels too - f/u in 1 month to re-evaluate symptoms and see if cymbalta helping pain

## 2015-10-03 NOTE — Assessment & Plan Note (Addendum)
Patient went to dermatologist and was told his rash could be psoriasis. I doubt this is psoriasis given the appearance and the chronicity of the rash. He does not have the rash in typical locations for psoriasis either (scalp, elbows, knees). Will await skin biopsy results.

## 2015-10-04 LAB — ANA: Anti Nuclear Antibody(ANA): NEGATIVE

## 2015-10-04 LAB — MPO/PR-3 (ANCA) ANTIBODIES

## 2015-10-04 LAB — CYCLIC CITRUL PEPTIDE ANTIBODY, IGG/IGA: CYCLIC CITRULLIN PEPTIDE AB: 10 U (ref 0–19)

## 2015-10-05 LAB — TISSUE TRANSGLUTAMINASE, IGA

## 2015-10-09 NOTE — Progress Notes (Signed)
Internal Medicine Clinic Attending  Case discussed with Dr. Rivet soon after the resident saw the patient.  We reviewed the resident's history and exam and pertinent patient test results.  I agree with the assessment, diagnosis, and plan of care documented in the resident's note.  

## 2015-10-15 ENCOUNTER — Other Ambulatory Visit: Payer: Self-pay | Admitting: Internal Medicine

## 2015-10-15 ENCOUNTER — Encounter: Payer: Self-pay | Admitting: Internal Medicine

## 2015-10-15 DIAGNOSIS — M791 Myalgia, unspecified site: Secondary | ICD-10-CM

## 2015-10-16 ENCOUNTER — Other Ambulatory Visit: Payer: Self-pay | Admitting: Internal Medicine

## 2015-10-16 DIAGNOSIS — J4521 Mild intermittent asthma with (acute) exacerbation: Secondary | ICD-10-CM

## 2015-10-16 DIAGNOSIS — M791 Myalgia, unspecified site: Secondary | ICD-10-CM

## 2015-10-16 MED ORDER — ALBUTEROL SULFATE HFA 108 (90 BASE) MCG/ACT IN AERS: 1.0000 | INHALATION_SPRAY | Freq: Four times a day (QID) | RESPIRATORY_TRACT | Status: DC | PRN

## 2015-10-16 MED ORDER — DULOXETINE HCL 30 MG PO CPEP: 30.0000 mg | ORAL_CAPSULE | Freq: Every day | ORAL | Status: DC

## 2015-11-14 NOTE — Addendum Note (Signed)
Addended by: Neomia DearPOWERS, Hugh Kamara E on: 11/14/2015 06:26 PM   Modules accepted: Orders

## 2015-11-27 ENCOUNTER — Encounter: Payer: Commercial Managed Care - PPO | Admitting: Internal Medicine

## 2016-04-09 ENCOUNTER — Encounter: Payer: Self-pay | Admitting: Internal Medicine

## 2016-04-09 ENCOUNTER — Ambulatory Visit (INDEPENDENT_AMBULATORY_CARE_PROVIDER_SITE_OTHER): Payer: Self-pay | Admitting: Internal Medicine

## 2016-04-09 VITALS — BP 117/68 | HR 90 | Temp 98.1°F | Ht 73.0 in | Wt 285.0 lb

## 2016-04-09 DIAGNOSIS — M25571 Pain in right ankle and joints of right foot: Secondary | ICD-10-CM

## 2016-04-09 DIAGNOSIS — M25572 Pain in left ankle and joints of left foot: Secondary | ICD-10-CM

## 2016-04-09 DIAGNOSIS — L409 Psoriasis, unspecified: Secondary | ICD-10-CM

## 2016-04-09 DIAGNOSIS — M25542 Pain in joints of left hand: Secondary | ICD-10-CM

## 2016-04-09 DIAGNOSIS — J4540 Moderate persistent asthma, uncomplicated: Secondary | ICD-10-CM

## 2016-04-09 DIAGNOSIS — M25541 Pain in joints of right hand: Secondary | ICD-10-CM

## 2016-04-09 DIAGNOSIS — M549 Dorsalgia, unspecified: Secondary | ICD-10-CM

## 2016-04-09 DIAGNOSIS — R197 Diarrhea, unspecified: Secondary | ICD-10-CM

## 2016-04-09 DIAGNOSIS — M2550 Pain in unspecified joint: Secondary | ICD-10-CM

## 2016-04-09 MED ORDER — ALBUTEROL SULFATE HFA 108 (90 BASE) MCG/ACT IN AERS: 1.0000 | INHALATION_SPRAY | Freq: Four times a day (QID) | RESPIRATORY_TRACT | Status: AC | PRN

## 2016-04-09 MED ORDER — ESCITALOPRAM OXALATE 10 MG PO TABS: 10.0000 mg | ORAL_TABLET | Freq: Every day | ORAL | Status: DC

## 2016-04-09 MED ORDER — DICLOFENAC SODIUM 1 % TD GEL: 4.0000 g | Freq: Four times a day (QID) | TRANSDERMAL | Status: DC

## 2016-04-09 MED ORDER — ALBUTEROL SULFATE HFA 108 (90 BASE) MCG/ACT IN AERS: 1.0000 | INHALATION_SPRAY | Freq: Four times a day (QID) | RESPIRATORY_TRACT | Status: DC | PRN

## 2016-04-09 MED ORDER — FLUTICASONE-SALMETEROL 100-50 MCG/DOSE IN AEPB: 1.0000 | INHALATION_SPRAY | Freq: Two times a day (BID) | RESPIRATORY_TRACT | Status: DC

## 2016-04-09 NOTE — Progress Notes (Signed)
Internal Medicine Clinic Attending  Case discussed with Dr. Rivet at the time of the visit.  We reviewed the resident's history and exam and pertinent patient test results.  I agree with the assessment, diagnosis, and plan of care documented in the resident's note.  

## 2016-04-09 NOTE — Assessment & Plan Note (Signed)
Biopsied by derm. Appears to be healing well. Continue topical cream.

## 2016-04-09 NOTE — Assessment & Plan Note (Signed)
He reports daily symptoms and is having to use his albuterol daily. Will add back on Advair as he tolerated this well before.  - Refilled albuterol - Start Advair 1 puff twice daily

## 2016-04-09 NOTE — Assessment & Plan Note (Signed)
He continues to have multiple episodes of diarrhea daily. Although, I am suspicious as he has gained 15 lbs in the last 8 months and denies any abdominal pain. He did note some nausea/vomiting but this is very infrequent (less than once per month). I referred him to GI for further evaluation.

## 2016-04-09 NOTE — Assessment & Plan Note (Signed)
He continues to have polyarthralgias with his feet, hands, and back bothering him the most. Rheumatology felt that his symptoms were not consistent with an inflammatory arthritis and he did not improve with a short course of steroids. Difficult situation as patient reports multiple pain medications have failed, however I feel that he has not fully committed to trying these therapies and may not be true failures. 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 He keeps hinting at wanting to start opioids but I do not feel this is appropriate for his condition. I ordered a UDS to make sure he is already not taking opioids/getting them from another provider. When I presented my plan of starting a different SSRI (Lexapro) and using voltaren gel on the affected joints, he immediately stated, "Can I be referred to a pain clinic? I do not think you guys will be able to manage my pain here." I stated that I had no problem referring him to pain clinic. The lab technician showed me his provided urine sample, which was completely clear and warm. This urine sample was clearly tampored with and likely faucet water. With this information and given his vague symptoms and no physical exam findings to suggest arthritis, he should not receive any opioids from our clinic. I still prescribed him the Lexapro and voltaren gel but I am highly doubtful that he will start taking these medications.

## 2016-04-09 NOTE — Patient Instructions (Signed)
  General Instructions: - Start taking Escitalopram 10 mg daily - This will take 4-6 weeks before it takes full affect - In the meantime use voltaren gel on your affected joints - I have placed a referral to Gastroenterology. Please keep this appointment.  Please bring your medicines with you each time you come to clinic.  Medicines may include prescription medications, over-the-counter medications, herbal remedies, eye drops, vitamins, or other pills.   Progress Toward Treatment Goals:  No flowsheet data found.  Self Care Goals & Plans:  No flowsheet data found.  No flowsheet data found.   Care Management & Community Referrals:  No flowsheet data found.

## 2016-04-09 NOTE — Progress Notes (Signed)
   Subjective:    Patient ID: Tim Jackson, male    DOB: 04-28-1976, 40 y.o.   MRN: [id number]  HPI Tim Jackson is a 40yo man with PMHx of asthma who presents today for follow up of his polyarthralgias.  Polyarthralgias: Previous diagnosis of fibromyalgia at [facility] in 2010. He was referred by our clinic to Rheumatology as he was having both myalgias and arthralgias. Autoimmune work up, including ESR, CRP, ANA, ANCA, RF, CCP, acute hepatitis panel, quantiferon, and HLA-B27 which were all negative. He was evaluated by Rheumatology who felt his diffuse arthralgias were not typical of inflammatory arthritis. They tried him on a short prednisone dosepack which he states did not relieve his symptoms and Rheum felt if he did not respond to prednisone that he likely does not have an inflammatory arthritis. Today, he reports he is still having "pain all over." He states the pain is worst in his hands, feet, and lower back, but also involves his knees and elbows. He describes the pain as "achy." He reports stiffness in his elbows occasionally. He wants to know what can be used for pain management. He has been tried on several different medications, including tramadol, gabapentin, lyrica, amitriptyline, and cymbalta. He reports he cannot take NSAIDs due to prior bleeding from a gastric ulcer.   Psoriasis: Biopsy of his umbilicus revealed psoriasis. He uses a topical cream which he states has improved his rash. He was evaluated by rheumatology as well as above.   Asthma: Reports having to use his albuterol every day either in the morning or at night. He notes wheezing particularly at night. He states his asthma acts up due to his pets at home and he does much better once he is out of the house. He reports he was previously on Advair when his asthma had flared when he lived in a house with mold, but this was stopped after he moved.   Diarrhea: Reports he is still having diarrhea 5-6 times daily. He has tried gluten-free  and dairy-free diets with no relief. Tissue transglutaminase was negative in He states his appetite has remained the same and actually reports weight gain (15 lbs per our records since Sept 2016). He missed his last GI appointment that was scheduled. He denies any abdominal pain, fevers, chills, melena, or hematochezia, but does note a few episodes of nausea/vomiting over the last few months. His last episode of N/V was last week and he reported green emesis.    Review of Systems General: Denies fever, chills, night sweats, changes in weight, changes in appetite HEENT: Denies headaches, ear pain, changes in vision, rhinorrhea, sore throat CV: Denies CP, palpitations, SOB, orthopnea Pulm: Denies SOB, cough, wheezing GI: Denies abdominal pain, nausea, vomiting, diarrhea, constipation, melena, hematochezia GU: Denies dysuria, hematuria, frequency Msk: Denies muscle cramps, joint pains Neuro: Denies weakness, numbness, tingling Skin: Denies rashes, bruising Psych: Denies depression, anxiety, hallucinations    Objective:   Physical Exam General: alert, sitting up, NAD HEENT: [place]/AT, EOMI, sclera anicteric, mucus membranes moist CV: RRR, no m/g/r Pulm: CTA bilaterally, breaths non-labored  Abd: BS+, soft, obese, non-tender Ext: warm, no peripheral edema. No joint swelling, erythema, or tenderness present in any joints. Mild stiffness of right elbow. ROM full.  Neuro: alert and oriented x 3, strength 5/5 in upper and lower extremities Skin: No rashes present, Psoriasis surrounding umbilicus appears improved.      Assessment & Plan:  Please refer to A&P documentation.

## 2016-04-15 LAB — TOXASSURE SELECT,+ANTIDEPR,UR

## 2016-05-01 ENCOUNTER — Encounter: Payer: Self-pay | Admitting: *Deleted

## 2016-05-10 ENCOUNTER — Emergency Department (HOSPITAL_BASED_OUTPATIENT_CLINIC_OR_DEPARTMENT_OTHER): Payer: Self-pay

## 2016-05-10 ENCOUNTER — Encounter (HOSPITAL_BASED_OUTPATIENT_CLINIC_OR_DEPARTMENT_OTHER): Payer: Self-pay | Admitting: Emergency Medicine

## 2016-05-10 ENCOUNTER — Emergency Department (HOSPITAL_BASED_OUTPATIENT_CLINIC_OR_DEPARTMENT_OTHER)
Admission: EM | Admit: 2016-05-10 | Discharge: 2016-05-10 | Disposition: A | Discharge status: Home / Self Care | Payer: Self-pay | Attending: Emergency Medicine | Admitting: Emergency Medicine

## 2016-05-10 DIAGNOSIS — Y999 Unspecified external cause status: Secondary | ICD-10-CM | POA: Insufficient documentation

## 2016-05-10 DIAGNOSIS — S62319A Displaced fracture of base of unspecified metacarpal bone, initial encounter for closed fracture: Secondary | ICD-10-CM

## 2016-05-10 DIAGNOSIS — J45909 Unspecified asthma, uncomplicated: Secondary | ICD-10-CM | POA: Insufficient documentation

## 2016-05-10 DIAGNOSIS — Y929 Unspecified place or not applicable: Secondary | ICD-10-CM | POA: Insufficient documentation

## 2016-05-10 DIAGNOSIS — W228XXA Striking against or struck by other objects, initial encounter: Secondary | ICD-10-CM | POA: Insufficient documentation

## 2016-05-10 DIAGNOSIS — S62308A Unspecified fracture of other metacarpal bone, initial encounter for closed fracture: Secondary | ICD-10-CM

## 2016-05-10 DIAGNOSIS — Y939 Activity, unspecified: Secondary | ICD-10-CM | POA: Insufficient documentation

## 2016-05-10 DIAGNOSIS — Z87891 Personal history of nicotine dependence: Secondary | ICD-10-CM | POA: Insufficient documentation

## 2016-05-10 DIAGNOSIS — S62396A Other fracture of fifth metacarpal bone, right hand, initial encounter for closed fracture: Secondary | ICD-10-CM | POA: Insufficient documentation

## 2016-05-10 HISTORY — DX: Displaced fracture of base of unspecified metacarpal bone, initial encounter for closed fracture: S62.319A

## 2016-05-10 MED ORDER — OXYCODONE-ACETAMINOPHEN 5-325 MG PO TABS
1.0000 | ORAL_TABLET | Freq: Once | ORAL | Status: AC
  Administered 2016-05-10: 1 via ORAL
  Filled 2016-05-10: qty 1

## 2016-05-10 MED ORDER — OXYCODONE-ACETAMINOPHEN 5-325 MG PO TABS: 1.0000 | ORAL_TABLET | ORAL | Status: DC | PRN

## 2016-05-10 NOTE — ED Notes (Signed)
Pt has injury to right hand from hitting it on a door. Mild swelling noted to lateral side of right hand. Pt has ice on hand in triage.

## 2016-05-10 NOTE — ED Notes (Signed)
CMS intact before and after. Pt tolerated well. Pt had no questions.  

## 2016-05-10 NOTE — ED Notes (Signed)
Pt started complaining of numbness and tingling about 10 minutes after EMT left. Pt had arm sitting on elbow, so blood was rushing downward. EMT restarted entire splint and pt stated that he felt much better.

## 2016-05-10 NOTE — ED Provider Notes (Signed)
CSN: [id number]     Arrival date & time 05/10/16  1746 History   First MD Initiated Contact with Patient 05/10/16 1756     Chief Complaint  Patient presents with  . Hand Injury     (Consider location/radiation/quality/duration/timing/severity/associated sxs/prior Treatment) HPI Tim Jackson is a 40 y.o. male with PMH significant for asthma and fibromyalgia who presents with sudden onset, traumatic, constant, moderate right hand pain that began a couple of hours ago.  Patient reports their bird escaped out the door and during the process he hit is hand on the door frame.  No medications PTA.  Associated symptoms include swelling.  No color change, numbness, or weakness.  No other pertinent symptoms.   Past Medical History  Diagnosis Date  . Asthma   . Fibromyalgia    Past Surgical History  Procedure Laterality Date  . Hand surgery     Family History  Problem Relation Age of Onset  . Cancer Mother   . Heart disease Father   . Cancer Maternal Grandfather   . Cancer Paternal Grandfather    Social History  Substance Use Topics  . Smoking status: Former Smoker -- 0.50 packs/day for 2 years  . Smokeless tobacco: None  . Alcohol Use: No    Review of Systems All other systems negative unless otherwise stated in HPI    Allergies  Amitriptyline; Fish allergy; Neurontin; and Penicillins  Home Medications   Prior to Admission medications   Medication Sig Start Date End Date Taking? Authorizing Provider  albuterol (PROVENTIL HFA;VENTOLIN HFA) 108 (90 Base) MCG/ACT inhaler Inhale 1-2 puffs into the lungs every 6 (six) hours as needed for wheezing or shortness of breath. 04/09/16   Carly Arlyce Harman, MD  diclofenac sodium (VOLTAREN) 1 % GEL Apply 4 g topically 4 (four) times daily. 04/09/16   Carly Arlyce Harman, MD  escitalopram (LEXAPRO) 10 MG tablet Take 1 tablet (10 mg total) by mouth daily. 04/09/16   Carly J Rivet, MD  Fluticasone-Salmeterol (ADVAIR DISKUS) 100-50 MCG/DOSE AEPB Inhale 1 puff  into the lungs 2 (two) times daily. 04/09/16 04/09/17  Carly J Rivet, MD   BP 131/77 mmHg  Pulse 86  Temp(Src) 98.8 F (37.1 C)  Resp 18  Ht  (1.854 m)  Wt 127.007 kg  BMI 36.95 kg/m2  SpO2 96% Physical Exam  Constitutional: He is oriented to person, place, and time. He appears well-developed and well-nourished.  HENT:  Head: Normocephalic and atraumatic.  Right Ear: External ear normal.  Left Ear: External ear normal.  Eyes: Conjunctivae are normal. No scleral icterus.  Neck: No tracheal deviation present.  Cardiovascular: Normal rate and regular rhythm.   Pulmonary/Chest: Effort normal and breath sounds normal. No respiratory distress.  Abdominal: He exhibits no distension.  Musculoskeletal: He exhibits edema and tenderness.       Right hand: He exhibits decreased range of motion (due to pain), tenderness, bony tenderness and swelling. He exhibits normal capillary refill and no deformity. Normal sensation noted. Decreased strength (due to pain) noted.       Hands: Compartment soft and compressible. No anatomical snuffbox tenderness.  No tenderness of distal radius or ulna.   Neurological: He is alert and oriented to person, place, and time. He has normal strength. No sensory deficit.  Skin: Skin is warm and dry.  Psychiatric: He has a normal mood and affect. His behavior is normal.    ED Course  Procedures (including critical care time) Labs Review Labs Reviewed - No  data to display  Imaging Review Dg Hand Complete Right  05/10/2016  CLINICAL DATA:  Patient hit hand on door EXAM: RIGHT HAND - COMPLETE 3+ VIEW COMPARISON:  None. FINDINGS: Frontal, oblique, and lateral views were obtained. There is a comminuted fracture of the proximal fifth metacarpal with volar angulation distally. There are multiple displaced fracture fragments in this area. No other fractures are evident. No dislocation. Joint spaces appear normal. No erosive change. IMPRESSION: Comminuted fracture proximal  fifth metacarpal with volar angulation distally. Multiple displaced fracture fragments are noted in the proximal fifth metacarpal region. No other fractures. No dislocation. Joint spaces appear unremarkable. Electronically Signed   By: Bretta BangWilliam  Woodruff III M.D.   On: 05/10/2016 18:58   I have personally reviewed and evaluated these images and lab results as part of my medical decision-making.   EKG Interpretation None      MDM   Final diagnoses:  Closed fracture of 5th metacarpal, initial encounter   Patient presents with right hand pain.  Neurovascularly intact.  Compartment soft and compressible. lain films remarkable for comminuted fx proximal 5th MCP with volar angulation distally.  Multiple displaced fracture fragments are noted in proximal 5th MCP region.  Spoke with hand surgery, appreciate Dr. Mina MarbleWeingold. Will place in ulnar-gutter splint.  Percocet for pain control.  Follow up with his office Monday or Tuesday and will likely undergo surgical fixation Wednesday. Discussed return precautions.  Patient agrees and acknowledges the above plan for discharge.      Cheri FowlerKayla Yitty Roads, PA-C 05/10/16 2022  Laurence Spatesachel Morgan Little, MD 05/11/16 [phone]

## 2016-05-10 NOTE — ED Notes (Signed)
Inquired with PA about giving pt pain medication. Awaiting orders.

## 2016-05-10 NOTE — Discharge Instructions (Signed)
Metacarpal Fracture °A metacarpal fracture is a break (fracture) of a bone in the hand. Metacarpals are the bones that extend from your knuckles to your wrist. In each hand, you have five metacarpal bones that connect your fingers and your thumb to your wrist. °Some hand fractures have bone pieces that are close together and stable (simple). These fractures may be treated with only a splint or cast. Hand fractures that have many pieces of broken bone (comminuted), unstable bone pieces (displaced), or a bone that breaks through the skin (compound) usually require surgery. °CAUSES °This injury may be caused by: °· A fall. °· A hard, direct hit to your hand. °· An injury that squeezes your knuckle, stretches your finger out of place, or crushes your hand. °RISK FACTORS °This injury is more likely to occur if: °· You play contact sports. °· You have certain bone diseases. °SYMPTOMS  °Symptoms of this type of fracture develop soon after the injury. Symptoms may include: °· Swelling. °· Pain. °· Stiffness. °· Increased pain with movement. °· Bruising. °· Inability to move a finger. °· A shortened finger. °· A finger knuckle that looks sunken in. °· Unusual appearance of the hand or finger (deformity). °DIAGNOSIS  °This injury may be diagnosed based on your signs and symptoms, especially if you had a recent hand injury. Your health care provider will perform a physical exam. He or she may also order X-rays to confirm the diagnosis.  °TREATMENT  °Treatment for this injury depends on the type of fracture you have and how severe it is. Possible treatments include: °· Non-reduction. This can be done if the bone does not need to be moved back into place. The fracture can be casted or splinted as it is.   °· Closed reduction. If your bone is stable and can be moved back into place, you may only need to wear a cast or splint or have buddy taping. °· Closed reduction with internal fixation (CRIF). This is the most common  treatment. You may have this procedure if your bone can be moved back into place but needs more support. Wires, pins, or screws may be inserted through your skin to stabilize the fracture. °· Open reduction with internal fixation (ORIF). This may be needed if your fracture is severe and unstable. It involves surgery to move your bone back into the right position. Screws, wires, or plates are used to stabilize the fracture. °After all procedures, you may need to wear a cast or a splint for several weeks. You will also need to have follow-up X-rays to make sure that the bone is healing well and staying in position. After you no longer need your cast or splint, you may need physical therapy. This will help you to regain full movement and strength in your hand.  °HOME CARE INSTRUCTIONS  °If You Have a Cast: °· Do not stick anything inside the cast to scratch your skin. Doing that increases your risk of infection. °· Check the skin around the cast every day. Report any concerns to your health care provider. You may put lotion on dry skin around the edges of the cast. Do not apply lotion to the skin underneath the cast. °If You Have a Splint: °· Wear it as directed by your health care provider. Remove it only as directed by your health care provider. °· Loosen the splint if your fingers become numb and tingle, or if they turn cold and blue. °Bathing °· Cover the cast or splint with a   watertight plastic bag to protect it from water while you take a bath or a shower. Do not let the cast or splint get wet. °Managing Pain, Stiffness, and Swelling °· If directed, apply ice to the injured area (if you have a splint, not a cast): °¨ Put ice in a plastic bag. °¨ Place a towel between your skin and the bag. °¨ Leave the ice on for 20 minutes, 2-3 times a day. °· Move your fingers often to avoid stiffness and to lessen swelling. °· Raise the injured area above the level of your heart while you are sitting or lying  down. °Driving °· Do not drive or operate heavy machinery while taking pain medicine. °· Do not drive while wearing a cast or splint on a hand that you use for driving. °Activity °· Return to your normal activities as directed by your health care provider. Ask your health care provider what activities are safe for you. °General Instructions °· Do not put pressure on any part of the cast or splint until it is fully hardened. This may take several hours. °· Keep the cast or splint clean and dry. °· Do not use any tobacco products, including cigarettes, chewing tobacco, or electronic cigarettes. Tobacco can delay bone healing. If you need help quitting, ask your health care provider. °· Take medicines only as directed by your health care provider. °· Keep all follow-up visits as directed by your health care provider. This is important. °SEEK MEDICAL CARE IF:  °· Your pain is getting worse. °· You have redness, swelling, or pain in the injured area.   °· You have fluid, blood, or pus coming from under your cast or splint.   °· You notice a bad smell coming from under your cast or splint.   °· You have a fever.   °SEEK IMMEDIATE MEDICAL CARE IF:  °· You develop a rash.   °· You have trouble breathing.   °· Your skin or nails on your injured hand turn blue or gray even after you loosen your splint. °· Your injured hand feels cold or becomes numb even after you loosen your splint.   °· You develop severe pain under the cast or in your hand. °  °This information is not intended to replace advice given to you by your health care provider. Make sure you discuss any questions you have with your health care provider. °  °Document Released: 11/24/2005 Document Revised: 08/15/2015 Document Reviewed: 09/13/2014 °Elsevier Interactive Patient Education ©2016 [employer] ° °

## 2016-05-12 ENCOUNTER — Other Ambulatory Visit: Payer: Self-pay | Admitting: Orthopedic Surgery

## 2016-05-13 ENCOUNTER — Encounter (HOSPITAL_BASED_OUTPATIENT_CLINIC_OR_DEPARTMENT_OTHER): Payer: Self-pay | Admitting: *Deleted

## 2016-05-14 ENCOUNTER — Ambulatory Visit (HOSPITAL_BASED_OUTPATIENT_CLINIC_OR_DEPARTMENT_OTHER)
Admission: RE | Admit: 2016-05-14 | Discharge: 2016-05-14 | Disposition: A | Discharge status: Home / Self Care | Payer: PRIVATE HEALTH INSURANCE | Source: Ambulatory Visit | Attending: Orthopedic Surgery | Admitting: Orthopedic Surgery

## 2016-05-14 ENCOUNTER — Encounter (HOSPITAL_BASED_OUTPATIENT_CLINIC_OR_DEPARTMENT_OTHER): Payer: Self-pay | Admitting: Anesthesiology

## 2016-05-14 ENCOUNTER — Ambulatory Visit (HOSPITAL_BASED_OUTPATIENT_CLINIC_OR_DEPARTMENT_OTHER): Payer: PRIVATE HEALTH INSURANCE | Admitting: Anesthesiology

## 2016-05-14 ENCOUNTER — Encounter (HOSPITAL_BASED_OUTPATIENT_CLINIC_OR_DEPARTMENT_OTHER)
Admission: RE | Disposition: A | Discharge status: Home / Self Care | Payer: Self-pay | Source: Ambulatory Visit | Attending: Orthopedic Surgery

## 2016-05-14 DIAGNOSIS — Z87891 Personal history of nicotine dependence: Secondary | ICD-10-CM | POA: Insufficient documentation

## 2016-05-14 DIAGNOSIS — Z880 Allergy status to penicillin: Secondary | ICD-10-CM | POA: Insufficient documentation

## 2016-05-14 DIAGNOSIS — Z91013 Allergy to seafood: Secondary | ICD-10-CM | POA: Insufficient documentation

## 2016-05-14 DIAGNOSIS — S62316A Displaced fracture of base of fifth metacarpal bone, right hand, initial encounter for closed fracture: Secondary | ICD-10-CM | POA: Insufficient documentation

## 2016-05-14 DIAGNOSIS — L4050 Arthropathic psoriasis, unspecified: Secondary | ICD-10-CM | POA: Insufficient documentation

## 2016-05-14 DIAGNOSIS — M797 Fibromyalgia: Secondary | ICD-10-CM | POA: Insufficient documentation

## 2016-05-14 DIAGNOSIS — J45909 Unspecified asthma, uncomplicated: Secondary | ICD-10-CM | POA: Insufficient documentation

## 2016-05-14 DIAGNOSIS — Z888 Allergy status to other drugs, medicaments and biological substances status: Secondary | ICD-10-CM | POA: Insufficient documentation

## 2016-05-14 DIAGNOSIS — Z79899 Other long term (current) drug therapy: Secondary | ICD-10-CM | POA: Insufficient documentation

## 2016-05-14 HISTORY — DX: Personal history of peptic ulcer disease: Z87.11

## 2016-05-14 HISTORY — PX: CLOSED REDUCTION METACARPAL WITH PERCUTANEOUS PINNING: SHX5613

## 2016-05-14 HISTORY — DX: Allergic rhinitis due to animal (cat) (dog) hair and dander: J30.81

## 2016-05-14 HISTORY — DX: Personal history of other diseases of the digestive system: Z87.19

## 2016-05-14 HISTORY — DX: Arthropathic psoriasis, unspecified: L40.50

## 2016-05-14 HISTORY — DX: Displaced fracture of base of unspecified metacarpal bone, initial encounter for closed fracture: S62.319A

## 2016-05-14 SURGERY — CLOSED REDUCTION, FRACTURE, METACARPAL BONE, WITH PERCUTANEOUS PINNING
Anesthesia: General | Site: Hand | Laterality: Right

## 2016-05-14 MED ORDER — SCOPOLAMINE 1 MG/3DAYS TD PT72: 1.0000 | MEDICATED_PATCH | Freq: Once | TRANSDERMAL | Status: DC | PRN

## 2016-05-14 MED ORDER — MIDAZOLAM HCL 2 MG/2ML IJ SOLN
INTRAMUSCULAR | Status: AC
  Filled 2016-05-14: qty 2

## 2016-05-14 MED ORDER — KETOROLAC TROMETHAMINE 30 MG/ML IJ SOLN
INTRAMUSCULAR | Status: DC | PRN
  Administered 2016-05-14: 30 mg via INTRAVENOUS

## 2016-05-14 MED ORDER — MIDAZOLAM HCL 2 MG/2ML IJ SOLN: 1.0000 mg | INTRAMUSCULAR | Status: DC | PRN

## 2016-05-14 MED ORDER — LACTATED RINGERS IV SOLN
INTRAVENOUS | Status: DC
  Administered 2016-05-14: 10 mL/h via INTRAVENOUS
  Administered 2016-05-14: 11:00:00 via INTRAVENOUS

## 2016-05-14 MED ORDER — LIDOCAINE 2% (20 MG/ML) 5 ML SYRINGE
INTRAMUSCULAR | Status: AC
  Filled 2016-05-14: qty 5

## 2016-05-14 MED ORDER — HYDROMORPHONE HCL 1 MG/ML IJ SOLN
INTRAMUSCULAR | Status: AC
  Filled 2016-05-14: qty 1

## 2016-05-14 MED ORDER — DEXAMETHASONE SODIUM PHOSPHATE 10 MG/ML IJ SOLN
INTRAMUSCULAR | Status: AC
  Filled 2016-05-14: qty 1

## 2016-05-14 MED ORDER — FENTANYL CITRATE (PF) 100 MCG/2ML IJ SOLN: 50.0000 ug | INTRAMUSCULAR | Status: DC | PRN

## 2016-05-14 MED ORDER — OXYCODONE HCL 5 MG PO TABS
5.0000 mg | ORAL_TABLET | Freq: Once | ORAL | Status: AC | PRN
Start: 2016-05-14 — End: 2016-05-14
  Administered 2016-05-14: 5 mg via ORAL

## 2016-05-14 MED ORDER — GLYCOPYRROLATE 0.2 MG/ML IJ SOLN
0.2000 mg | Freq: Once | INTRAMUSCULAR | Status: DC | PRN
Start: 2016-05-14 — End: 2016-05-14

## 2016-05-14 MED ORDER — ONDANSETRON HCL 4 MG/2ML IJ SOLN
INTRAMUSCULAR | Status: DC | PRN
  Administered 2016-05-14: 4 mg via INTRAVENOUS

## 2016-05-14 MED ORDER — VANCOMYCIN HCL 1000 MG IV SOLR
INTRAVENOUS | Status: AC
  Filled 2016-05-14: qty 1000

## 2016-05-14 MED ORDER — DIPHENHYDRAMINE HCL 50 MG/ML IJ SOLN
INTRAMUSCULAR | Status: DC | PRN
  Administered 2016-05-14: 25 mg via INTRAVENOUS

## 2016-05-14 MED ORDER — MIDAZOLAM HCL 5 MG/5ML IJ SOLN
INTRAMUSCULAR | Status: DC | PRN
  Administered 2016-05-14: 2 mg via INTRAVENOUS

## 2016-05-14 MED ORDER — FENTANYL CITRATE (PF) 100 MCG/2ML IJ SOLN
INTRAMUSCULAR | Status: AC
  Filled 2016-05-14: qty 2

## 2016-05-14 MED ORDER — KETOROLAC TROMETHAMINE 30 MG/ML IJ SOLN
INTRAMUSCULAR | Status: AC
  Filled 2016-05-14: qty 1

## 2016-05-14 MED ORDER — VANCOMYCIN HCL IN DEXTROSE 500-5 MG/100ML-% IV SOLN
INTRAVENOUS | Status: AC
  Filled 2016-05-14: qty 100

## 2016-05-14 MED ORDER — HYDROMORPHONE HCL 1 MG/ML IJ SOLN
0.2500 mg | INTRAMUSCULAR | Status: DC | PRN
  Administered 2016-05-14 (×4): 0.5 mg via INTRAVENOUS

## 2016-05-14 MED ORDER — OXYCODONE HCL 5 MG PO TABS
ORAL_TABLET | ORAL | Status: AC
  Filled 2016-05-14: qty 1

## 2016-05-14 MED ORDER — FENTANYL CITRATE (PF) 100 MCG/2ML IJ SOLN
INTRAMUSCULAR | Status: DC | PRN
  Administered 2016-05-14: 50 ug via INTRAVENOUS
  Administered 2016-05-14: 100 ug via INTRAVENOUS
  Administered 2016-05-14: 50 ug via INTRAVENOUS

## 2016-05-14 MED ORDER — OXYCODONE-ACETAMINOPHEN 5-325 MG PO TABS: 1.0000 | ORAL_TABLET | ORAL | Status: DC | PRN

## 2016-05-14 MED ORDER — DIPHENHYDRAMINE HCL 50 MG/ML IJ SOLN
INTRAMUSCULAR | Status: AC
  Filled 2016-05-14: qty 1

## 2016-05-14 MED ORDER — VANCOMYCIN HCL IN DEXTROSE 1-5 GM/200ML-% IV SOLN
1000.0000 mg | INTRAVENOUS | Status: AC
  Administered 2016-05-14: 1500 mg via INTRAVENOUS

## 2016-05-14 MED ORDER — CHLORHEXIDINE GLUCONATE 4 % EX LIQD: 60.0000 mL | Freq: Once | CUTANEOUS | Status: DC

## 2016-05-14 MED ORDER — PROPOFOL 10 MG/ML IV BOLUS
INTRAVENOUS | Status: AC
  Filled 2016-05-14: qty 20

## 2016-05-14 MED ORDER — DEXAMETHASONE SODIUM PHOSPHATE 4 MG/ML IJ SOLN
INTRAMUSCULAR | Status: DC | PRN
  Administered 2016-05-14: 10 mg via INTRAVENOUS

## 2016-05-14 MED ORDER — VANCOMYCIN HCL IN DEXTROSE 1-5 GM/200ML-% IV SOLN
INTRAVENOUS | Status: AC
  Filled 2016-05-14: qty 200

## 2016-05-14 MED ORDER — LIDOCAINE HCL (CARDIAC) 20 MG/ML IV SOLN
INTRAVENOUS | Status: DC | PRN
  Administered 2016-05-14: 30 mg via INTRAVENOUS

## 2016-05-14 MED ORDER — BUPIVACAINE HCL (PF) 0.25 % IJ SOLN
INTRAMUSCULAR | Status: DC | PRN
  Administered 2016-05-14: 6 mL

## 2016-05-14 MED ORDER — PROPOFOL 10 MG/ML IV BOLUS
INTRAVENOUS | Status: DC | PRN
  Administered 2016-05-14: 200 mg via INTRAVENOUS
  Administered 2016-05-14: 50 mg via INTRAVENOUS

## 2016-05-14 MED ORDER — ONDANSETRON HCL 4 MG/2ML IJ SOLN
INTRAMUSCULAR | Status: AC
  Filled 2016-05-14: qty 2

## 2016-05-14 MED ORDER — PROMETHAZINE HCL 25 MG/ML IJ SOLN: 6.2500 mg | INTRAMUSCULAR | Status: DC | PRN

## 2016-05-14 SURGICAL SUPPLY — 62 items
APL SKNCLS STERI-STRIP NONHPOA (GAUZE/BANDAGES/DRESSINGS)
BANDAGE ACE 3X5.8 VEL STRL LF (GAUZE/BANDAGES/DRESSINGS) ×3 IMPLANT
BANDAGE ACE 4X5 VEL STRL LF (GAUZE/BANDAGES/DRESSINGS) IMPLANT
BENZOIN TINCTURE PRP APPL 2/3 (GAUZE/BANDAGES/DRESSINGS) IMPLANT
BLADE SURG 15 STRL LF DISP TIS (BLADE) ×1 IMPLANT
BLADE SURG 15 STRL SS (BLADE) ×3
BNDG CMPR 9X4 STRL LF SNTH (GAUZE/BANDAGES/DRESSINGS) ×1
BNDG ELASTIC 2X5.8 VLCR STR LF (GAUZE/BANDAGES/DRESSINGS) IMPLANT
BNDG ESMARK 4X9 LF (GAUZE/BANDAGES/DRESSINGS) ×3 IMPLANT
BNDG GAUZE ELAST 4 BULKY (GAUZE/BANDAGES/DRESSINGS) ×3 IMPLANT
CANISTER SUCT 1200ML W/VALVE (MISCELLANEOUS) IMPLANT
CLOSURE WOUND 1/2 X4 (GAUZE/BANDAGES/DRESSINGS)
CORDS BIPOLAR (ELECTRODE) ×3 IMPLANT
COVER BACK TABLE 60X90IN (DRAPES) ×3 IMPLANT
CUFF TOURNIQUET SINGLE 18IN (TOURNIQUET CUFF) IMPLANT
DECANTER SPIKE VIAL GLASS SM (MISCELLANEOUS) IMPLANT
DRAPE EXTREMITY T 121X128X90 (DRAPE) ×6 IMPLANT
DRAPE OEC MINIVIEW 54X84 (DRAPES) ×3 IMPLANT
DRAPE SURG 17X23 STRL (DRAPES) ×3 IMPLANT
DURAPREP 26ML APPLICATOR (WOUND CARE) ×3 IMPLANT
GAUZE SPONGE 4X4 12PLY STRL (GAUZE/BANDAGES/DRESSINGS) ×3 IMPLANT
GAUZE SPONGE 4X4 16PLY XRAY LF (GAUZE/BANDAGES/DRESSINGS) IMPLANT
GAUZE XEROFORM 1X8 LF (GAUZE/BANDAGES/DRESSINGS) IMPLANT
GLOVE BIO SURGEON STRL SZ 6.5 (GLOVE) ×2 IMPLANT
GLOVE BIO SURGEONS STRL SZ 6.5 (GLOVE) ×1
GLOVE BIOGEL PI IND STRL 7.0 (GLOVE) ×1 IMPLANT
GLOVE BIOGEL PI INDICATOR 7.0 (GLOVE) ×2
GLOVE SURG SYN 8.0 (GLOVE) ×6 IMPLANT
GOWN STRL REUS W/ TWL LRG LVL3 (GOWN DISPOSABLE) ×1 IMPLANT
GOWN STRL REUS W/TWL LRG LVL3 (GOWN DISPOSABLE) ×3
GOWN STRL REUS W/TWL XL LVL3 (GOWN DISPOSABLE) ×6 IMPLANT
NEEDLE HYPO 25X1 1.5 SAFETY (NEEDLE) IMPLANT
NS IRRIG 1000ML POUR BTL (IV SOLUTION) IMPLANT
PACK BASIN DAY SURGERY FS (CUSTOM PROCEDURE TRAY) ×3 IMPLANT
PAD CAST 3X4 CTTN HI CHSV (CAST SUPPLIES) ×1 IMPLANT
PAD CAST 4YDX4 CTTN HI CHSV (CAST SUPPLIES) IMPLANT
PADDING CAST ABS 4INX4YD NS (CAST SUPPLIES) ×2
PADDING CAST ABS COTTON 4X4 ST (CAST SUPPLIES) ×1 IMPLANT
PADDING CAST COTTON 3X4 STRL (CAST SUPPLIES) ×3
PADDING CAST COTTON 4X4 STRL (CAST SUPPLIES)
PADDING UNDERCAST 2 STRL (CAST SUPPLIES) ×2
PADDING UNDERCAST 2X4 STRL (CAST SUPPLIES) ×1 IMPLANT
SHEET MEDIUM DRAPE 40X70 STRL (DRAPES) ×6 IMPLANT
SPLINT PLASTER CAST XFAST 4X15 (CAST SUPPLIES) IMPLANT
SPLINT PLASTER XTRA FAST SET 4 (CAST SUPPLIES)
STOCKINETTE 4X48 STRL (DRAPES) ×3 IMPLANT
STRIP CLOSURE SKIN 1/2X4 (GAUZE/BANDAGES/DRESSINGS) IMPLANT
SUCTION FRAZIER HANDLE 10FR (MISCELLANEOUS)
SUCTION TUBE FRAZIER 10FR DISP (MISCELLANEOUS) IMPLANT
SUT ETHILON 4 0 PS 2 18 (SUTURE) IMPLANT
SUT ETHILON 5 0 PS 2 18 (SUTURE) IMPLANT
SUT MERSILENE 4 0 P 3 (SUTURE) IMPLANT
SUT VIC AB 4-0 P-3 18XBRD (SUTURE) IMPLANT
SUT VIC AB 4-0 P3 18 (SUTURE)
SUT VICRYL RAPIDE 4-0 (SUTURE) IMPLANT
SUT VICRYL RAPIDE 4/0 PS 2 (SUTURE) IMPLANT
SYR BULB 3OZ (MISCELLANEOUS) IMPLANT
SYRINGE 10CC LL (SYRINGE) IMPLANT
TOWEL OR 17X24 6PK STRL BLUE (TOWEL DISPOSABLE) ×3 IMPLANT
TUBE CONNECTING 20'X1/4 (TUBING)
TUBE CONNECTING 20X1/4 (TUBING) IMPLANT
UNDERPAD 30X30 (UNDERPADS AND DIAPERS) ×3 IMPLANT

## 2016-05-14 NOTE — H&P (Signed)
Tim RacerKevin Jackson is an 40 y.o. male.   Chief Complaint: right hand pain and swelling HPI: as above s/p right hand trauma with displaced right small mc base fracture  Past Medical History  Diagnosis Date  . Fibromyalgia   . Psoriatic arthritis (HCC)   . History of gastric ulcer   . Closed fracture of base of metacarpal of right hand 05/10/2016    right small  . 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 Cat allergies     Past Surgical History  Procedure Laterality Date  . Orif metacarpal fracture Left     Family History  Problem Relation Age of Onset  . Cancer Mother   . Heart disease Father   . Cancer Maternal Grandfather   . Cancer Paternal Grandfather    Social History:  reports that he quit smoking about 14 years ago. He has never used smokeless tobacco. He reports that he does not drink alcohol or use illicit drugs.  Allergies:  Allergies  Allergen Reactions  . Amitriptyline Shortness Of Breath  . Neurontin [Gabapentin] Shortness Of Breath    HYPOTENSION  . Shellfish Allergy Shortness Of Breath  . Penicillins Other (See Comments)    UNKNOWN - AS A CHILD    Medications Prior to Admission  Medication Sig Dispense Refill  . albuterol (PROVENTIL HFA;VENTOLIN HFA) 108 (90 Base) MCG/ACT inhaler Inhale 1-2 puffs into the lungs every 6 (six) hours as needed for wheezing or shortness of breath. 1 Inhaler 2  . oxyCODONE-acetaminophen (PERCOCET/ROXICET) 5-325 MG tablet Take 1 tablet by mouth every 4 (four) hours as needed for severe pain. 20 tablet 0    No results found for this or any previous visit (from the past 48 hour(s)). No results found.  Review of Systems  All other systems reviewed and are negative.   Blood pressure 143/78, pulse 64, temperature 98.1 F (36.7 C), temperature source Oral, resp. rate 18, height 6\' 1"  (1.854 m), weight 123.832 kg (273 lb), SpO2 96 %. Physical Exam  Constitutional: He is oriented to person, place, and time. He appears well-developed and well-nourished.   HENT:  Head: Normocephalic and atraumatic.  Neck: Normal range of motion.  Cardiovascular: Normal rate.   Respiratory: Effort normal.  Musculoskeletal:       Right hand: He exhibits bony tenderness and swelling.  Right hand dorsal swelling and pain over small CMC joint  Neurological: He is alert and oriented to person, place, and time.  Skin: Skin is warm.  Psychiatric: He has a normal mood and affect. His behavior is normal. Judgment and thought content normal.     Assessment/Plan As above  Plan CRPP vs ORIF  Dairl PonderWEINGOLD,Shiven Junious A, MD 05/14/2016, 10:50 AM

## 2016-05-14 NOTE — Op Note (Signed)
See note [phone]

## 2016-05-14 NOTE — Transfer of Care (Signed)
Immediate Anesthesia Transfer of Care Note  Patient: Tim RacerKevin Lefever  Procedure(s) Performed: Procedure(s): CLOSED REDUCTION METACARPAL WITH PERCUTANEOUS PINNING RIGHT SMALL METACARPAL BASE FRACTURE (Right)  Patient Location: PACU  Anesthesia Type:General  Level of Consciousness: awake and patient cooperative  Airway & Oxygen Therapy: Patient Spontanous Breathing and Patient connected to face mask oxygen  Post-op Assessment: Report given to RN and Post -op Vital signs reviewed and stable  Post vital signs: Reviewed and stable  Last Vitals:  Filed Vitals:   05/14/16 1147 05/14/16 1149  BP:    Pulse: 90 81  Temp:    Resp:  15    Last Pain:  Filed Vitals:   05/14/16 1153  PainSc: 9          Complications: No apparent anesthesia complications

## 2016-05-14 NOTE — Anesthesia Procedure Notes (Signed)
Procedure Name: LMA Insertion Date/Time: 05/14/2016 11:12 AM Performed by: Genevieve NorlanderLINKA, Tim Jackson Pre-anesthesia Checklist: Patient identified, Emergency Drugs available, Suction available, Patient being monitored and Timeout performed Patient Re-evaluated:Patient Re-evaluated prior to inductionOxygen Delivery Method: Circle system utilized Preoxygenation: Pre-oxygenation with 100% oxygen Intubation Type: IV induction Ventilation: Mask ventilation without difficulty LMA: LMA inserted LMA Size: 5.0 Number of attempts: 1 Airway Equipment and Method: Bite block Placement Confirmation: positive ETCO2 Tube secured with: Tape Dental Injury: Teeth and Oropharynx as per pre-operative assessment

## 2016-05-14 NOTE — Discharge Instructions (Signed)

## 2016-05-15 ENCOUNTER — Encounter (HOSPITAL_BASED_OUTPATIENT_CLINIC_OR_DEPARTMENT_OTHER): Payer: Self-pay | Admitting: Orthopedic Surgery

## 2016-05-15 NOTE — Op Note (Signed)
NAMKennon Jackson:  Mckown, Amadeus                 ACCOUNT NO.:  [id number]  MEDICAL RECORD NO.:  [id number]  LOCATION:                                 FACILITY:  PHYSICIAN:  Artist PaisMatthew A. Slaton Reaser, M.D.DATE OF BIRTH:  October 19, 1976  DATE OF PROCEDURE:  05/14/2016 DATE OF DISCHARGE:                              OPERATIVE REPORT   PREOPERATIVE DIAGNOSIS:  Displaced intra-articular fracture, right small metacarpal base (CMC fracture dislocation).  POSTOPERATIVE DIAGNOSIS:  Displaced intra-articular fracture, right small metacarpal base (CMC fracture dislocation).  PROCEDURE:  Closed reduction and percutaneous pinning of above.  SURGEON:  Artist PaisMatthew A. Mina MarbleWeingold, M.D.  ASSISTANT:  None.  ANESTHESIA:  General.  TOURNIQUET TIME:  18 minutes.  COMPLICATIONS:  None.  DRAINS:  None.  DESCRIPTION OF PROCEDURE:  The patient was taken to the operating suite. After induction of adequate general anesthetic, right upper extremity was prepped and draped in sterile fashion.  An Esmarch was used to exsanguinate the limb.  The tourniquet was inflated to 250 mmHg.  At this point in time, longitudinal traction downward pressure was placed at the base of the small metacarpal at [facility].  Under fluoroscopic imaging, an 0.045 K-wire was driven from the metaphyseal diaphyseal flare of the small metacarpal into the ring metacarpal base. A second one was placed parallel and proximal to this to realign the intra-articular split, followed by third distal pin to maintain longitudinal length.  Intraoperative fluoroscopy AP lateral and obliques showed adequate reduction of the CMC joint and restoration of length.  K-wires were cut outside the skin and bent upon themselves.  Dressed with Xeroform, 4x4s, and an ulnar gutter splint.  The patient tolerated the procedure well and went to the recovery room in stable fashion.     Artist PaisMatthew A. Mina MarbleWeingold, M.D.   ______________________________ Artist PaisMatthew A. Mina MarbleWeingold,  M.D.    MAW/MEDQ  D:  05/14/2016  T:  05/15/2016  Job:  [id number]

## 2016-05-15 NOTE — Anesthesia Postprocedure Evaluation (Signed)
Anesthesia Post Note  Patient: Tim Jackson  Procedure(s) Performed: Procedure(s) (LRB): CLOSED REDUCTION METACARPAL WITH PERCUTANEOUS PINNING RIGHT SMALL METACARPAL BASE FRACTURE (Right)  Patient location during evaluation: PACU Anesthesia Type: General Level of consciousness: awake Pain management: pain level controlled Vital Signs Assessment: post-procedure vital signs reviewed and stable Respiratory status: spontaneous breathing Cardiovascular status: stable Anesthetic complications: no    Last Vitals:  Filed Vitals:   05/14/16 1245 05/14/16 1300  BP: 116/73 128/79  Pulse: 70 57  Temp:  36.6 C  Resp: 17 18    Last Pain:  Filed Vitals:   05/15/16 0927  PainSc: 8                  EDWARDS,Thetis Schwimmer

## 2016-05-15 NOTE — Anesthesia Preprocedure Evaluation (Addendum)
Anesthesia Evaluation  Patient identified by MRN, date of birth, ID band Patient awake    Reviewed: Allergy & Precautions, Patient's Chart, lab work & pertinent test results  Airway Mallampati: II  TM Distance: >3 FB Neck ROM: Full    Dental   Pulmonary asthma , former smoker,    breath sounds clear to auscultation       Cardiovascular negative cardio ROS   Rhythm:Regular Rate:Normal     Neuro/Psych    GI/Hepatic negative GI ROS, Neg liver ROS,   Endo/Other  negative endocrine ROS  Renal/GU negative Renal ROS     Musculoskeletal   Abdominal   Peds  Hematology   Anesthesia Other Findings   Reproductive/Obstetrics                            Anesthesia Physical Anesthesia Plan  ASA: III  Anesthesia Plan: General   Post-op Pain Management:    Induction: Intravenous  Airway Management Planned: LMA  Additional Equipment:   Intra-op Plan:   Post-operative Plan: Extubation in OR  Informed Consent: I have reviewed the patients History and Physical, chart, labs and discussed the procedure including the risks, benefits and alternatives for the proposed anesthesia with the patient or authorized representative who has indicated his/her understanding and acceptance.   Dental advisory given  Plan Discussed with: CRNA and Anesthesiologist  Anesthesia Plan Comments:        Anesthesia Quick Evaluation

## 2016-05-20 ENCOUNTER — Encounter: Payer: Self-pay | Admitting: Occupational Therapy

## 2016-05-20 ENCOUNTER — Ambulatory Visit: Payer: Self-pay | Attending: Orthopedic Surgery | Admitting: Occupational Therapy

## 2016-05-20 DIAGNOSIS — M6281 Muscle weakness (generalized): Secondary | ICD-10-CM | POA: Insufficient documentation

## 2016-05-20 DIAGNOSIS — M25531 Pain in right wrist: Secondary | ICD-10-CM | POA: Insufficient documentation

## 2016-05-20 DIAGNOSIS — M25631 Stiffness of right wrist, not elsewhere classified: Secondary | ICD-10-CM | POA: Insufficient documentation

## 2016-05-20 DIAGNOSIS — R600 Localized edema: Secondary | ICD-10-CM | POA: Insufficient documentation

## 2016-05-20 NOTE — Therapy (Signed)
[facility] [address] [place], [place], [address] Phone: [phone]   Fax:  [phone]  Occupational Therapy Evaluation  Patient Details  Name: Tim RacerKevin Jackson MRN: [id number] Date of Birth: 07/27/1976 Referring Provider: Dr. Dairl PonderMatthew Weingold  Encounter Date: 05/20/2016      OT End of Session - 05/20/16 1203    Visit Number 1   Number of Visits 8   Date for OT Re-Evaluation 07/20/16   Authorization Type self pay vs. [employer]   OT Start Time 1030   OT Stop Time 1120   OT Time Calculation (min) 50 min   Equipment Utilized During Treatment splint   Activity Tolerance Patient tolerated treatment well      Past Medical History  Diagnosis Date  . Fibromyalgia   . Psoriatic arthritis (HCC)   . History of gastric ulcer   . Closed fracture of base of metacarpal of right hand 05/10/2016    right small  . 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 Cat allergies     Past Surgical History  Procedure Laterality Date  . Orif metacarpal fracture Left   . Closed reduction metacarpal with percutaneous pinning Right 05/14/2016    Procedure: CLOSED REDUCTION METACARPAL WITH PERCUTANEOUS PINNING RIGHT SMALL METACARPAL BASE FRACTURE;  Surgeon: Dairl PonderMatthew Weingold, MD;  Location: [facility];  Service: Orthopedics;  Laterality: Right;    There were no vitals filed for this visit.      Subjective Assessment - 05/20/16 1034    Subjective  I originally broke it on June 3rd   Patient Stated Goals to get my hand back to normal   Currently in Pain? Yes   Pain Score 8    Pain Location Hand   Pain Orientation Right   Pain Descriptors / Indicators Throbbing;Dull   Pain Type Surgical pain   Pain Onset In the past 7 days   Pain Frequency Constant   Aggravating Factors  trying to sleep   Pain Relieving Factors meds           [facility] OT Assessment - 05/20/16 0001    Assessment   Diagnosis s/p ORIF with percutaneous pinning  d/t Rt small  MC base fx    Referring Provider Dr. Dairl PonderMatthew Weingold   Onset Date 05/14/16  surgery date   Assessment Pt arrived wrapped/protected with wrist immobolized (MP's and IP's free)   Prior Therapy none   Precautions   Precautions Other (comment)   Precaution Comments keep wrist immobolized until healed, pin site care   Required Braces or Orthoses Other Brace/Splint   Other Brace/Splint ulnar gutter splint, wrist based per MD orders   Balance Screen   Has the patient fallen in the past 6 months No   Has the patient had a decrease in activity level because of a fear of falling?  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 No signs of  infection   Edema   Edema moderate ulnar hand at 5th metacarpal                  OT Treatments/Exercises (OP) - 05/20/16 0001    ADLs   ADL Comments Pt educated in pin site care, splint wear and care, hygiene care, and precautions. Pt's post surgical dressing carefully removed, hand and forearm cleaned keeping wrist immobolized, prior to splint fabrication   Splinting   Splinting Fabricated and fitted ulnar gutter splint, wrist based per MD orders. Wrist was kept in neutral position (same position he arrived in). Issued splint and pt shown how to properly don/doff with pins.                OT Education - 05/20/16 1123    Education provided Yes   Education Details Precautions, hygiene care, pin site care,  splint wear and care   Person(s) Educated Patient   Methods Explanation;Handout   Comprehension Verbalized understanding;Returned demonstration          OT Short Term Goals - 05/20/16 1206    OT SHORT TERM GOAL #1   Title Independent with splint wear and care   Time 2   Period Weeks   Status On-going   OT SHORT TERM GOAL #2   Title Independent with pin site care   Time 2   Period Weeks   Status On-going           OT Long Term Goals - 05/20/16 1207    OT LONG TERM GOAL #1   Title Independent with HEP (once cleared by MD)    Time 8   Period Weeks   Status New   OT LONG TERM GOAL #2   Title Pt to return to functional use of Rt hand for ADLS including writing   Time 8   Period Weeks   Status New   OT LONG TERM GOAL #3   Title Wrist ROM WFL's for all functional tasks   Time 8   Period Weeks   Status New   OT LONG TERM GOAL #4   Title Pain less than or equal to 3/10 with ADLS   Time 8   Period Weeks   Status New               Plan - 05/20/16 1204    Clinical Impression Statement Pt is a 40 y.o. male who presents to outpatient rehab s/p ORIF with percutaneous pinning following Rt small finger MC base fracture. Pt arrived wrapped/protected with wrist immobolized (MP's and IP's free) for splint fabrication. Fabricated and issued splint per MD orders.    Rehab Potential Good   OT Frequency 1x / week   OT Duration 8 weeks   OT Treatment/Interventions Self-care/ADL training;Therapeutic exercise;Patient/family education;Ultrasound;Manual Therapy;Splinting;Therapeutic exercises;Cryotherapy;Parrafin;DME and/or AE instruction;Therapeutic activities;Electrical Stimulation;Scar mobilization;Moist Heat;Contrast Bath;Passive range of motion   Plan splint adjustments prn    Consulted and Agree with Plan of Care Patient      Patient will benefit from skilled therapeutic intervention in order to improve the following deficits and impairments:  Decreased range of motion,  Improper body mechanics, Increased edema, Impaired sensation, Impaired UE functional use, Pain, Decreased strength  Visit Diagnosis: Pain in right wrist - Plan: Ot plan of care cert/re-cert  Localized edema - Plan: Ot plan of care cert/re-cert  Stiffness of right wrist, not elsewhere classified - Plan: Ot plan of care cert/re-cert  Muscle weakness (generalized) - Plan: Ot plan of care cert/re-cert  Problem List Patient Active Problem List   Diagnosis Date Noted  . Moderate persistent asthma 08/24/2015  . Polyarthralgia 08/24/2015  . Diarrhea 08/24/2015  . Healthcare maintenance 08/24/2015  . Psoriasis 08/24/2015    Kelli Churn, OTR/L 05/20/2016, 12:12 PM  [facility] [address] [place], [place], [address] Phone: [phone]   Fax:  [phone]  Name: Salomon Ganser MRN: [id number] Date of Birth: [birth year]

## 2016-05-20 NOTE — Patient Instructions (Signed)
WEARING SCHEDULE:  Wear splint at ALL times except for hygiene care (May remove splint for exercises and then immediately place back on ONLY if directed by the therapist)  PURPOSE:  To prevent movement and for protection until injury can heal   SPLINT WEAR AND CARE:   CARE OF SPLINT:  Keep splint away from heat sources including: stove, radiator or furnace, or a car in sunlight. The splint can melt and will no longer fit you properly  Keep away from pets and children  Clean the splint with rubbing alcohol 1-2 times per day. Clean base of pins with hydrogen peroxide using Q-tips 2x/day. (Clean stockinette by hand washing with mild soap/water, lay flat to dry. Change daily)  * During this time, make sure you also clean your hand/arm as instructed by your therapist and/or perform dressing changes as needed. Then dry hand/arm completely before replacing splint. (When cleaning hand/arm, keep it immobilized in same position until splint is replaced)  PRECAUTIONS/POTENTIAL PROBLEMS: *If you notice or experience increased pain, swelling, numbness, or a lingering reddened area from the splint: Contact your therapist immediately by calling [phone]. You must wear the splint for protection, but we will get you scheduled for adjustments as quickly as possible.  (If only straps or hooks need to be replaced and NO adjustments to the splint need to be made, just call the office ahead and let them know you are coming in)  If you have any medical concerns or signs of infection, please call your doctor immediately

## 2016-05-22 ENCOUNTER — Emergency Department (HOSPITAL_COMMUNITY)
Admission: EM | Admit: 2016-05-22 | Discharge: 2016-05-22 | Disposition: A | Discharge status: Home / Self Care | Payer: PRIVATE HEALTH INSURANCE | Attending: Emergency Medicine | Admitting: Emergency Medicine

## 2016-05-22 ENCOUNTER — Encounter (HOSPITAL_COMMUNITY): Payer: Self-pay

## 2016-05-22 ENCOUNTER — Emergency Department (HOSPITAL_COMMUNITY)
Admission: EM | Admit: 2016-05-22 | Discharge: 2016-05-22 | Disposition: A | Discharge status: Home / Self Care | Payer: PRIVATE HEALTH INSURANCE | Source: Home / Self Care | Attending: Emergency Medicine | Admitting: Emergency Medicine

## 2016-05-22 DIAGNOSIS — M79641 Pain in right hand: Secondary | ICD-10-CM

## 2016-05-22 DIAGNOSIS — Z87891 Personal history of nicotine dependence: Secondary | ICD-10-CM | POA: Insufficient documentation

## 2016-05-22 MED ORDER — OXYCODONE-ACETAMINOPHEN 5-325 MG PO TABS
1.0000 | ORAL_TABLET | Freq: Once | ORAL | Status: AC
  Administered 2016-05-22: 1 via ORAL
  Filled 2016-05-22: qty 1

## 2016-05-22 MED ORDER — OXYCODONE-ACETAMINOPHEN 5-325 MG PO TABS: 2.0000 | ORAL_TABLET | ORAL | Status: DC | PRN

## 2016-05-22 NOTE — ED Notes (Signed)
Pt stable, ambulatory, states understanding of discharge instructions 

## 2016-05-22 NOTE — ED Notes (Signed)
Pt st's he had hand surg with pins 1 week ago.  Pt c/o pain to area.

## 2016-05-22 NOTE — Discharge Instructions (Signed)
Keep scheduled appointment with your hand orthopedic surgeon on Monday. Take pain medication as needed. Return to the ED if you experience severe worsening of your symptoms, increased redness or swelling around your wound, fevers, chills.

## 2016-05-22 NOTE — ED Notes (Signed)
Pt arrived post surgery right hand pain

## 2016-05-22 NOTE — Discharge Instructions (Signed)
Cryotherapy °Cryotherapy means treatment with cold. Ice or gel packs can be used to reduce both pain and swelling. Ice is the most helpful within the first 24 to 48 hours after an injury or flare-up from overusing a muscle or joint. Sprains, strains, spasms, burning pain, shooting pain, and aches can all be eased with ice. Ice can also be used when recovering from surgery. Ice is effective, has very few side effects, and is safe for most people to use. °PRECAUTIONS  °Ice is not a safe treatment option for people with: °· Raynaud phenomenon. This is a condition affecting small blood vessels in the extremities. Exposure to cold may cause your problems to return. °· Cold hypersensitivity. There are many forms of cold hypersensitivity, including: °¨ Cold urticaria. Red, itchy hives appear on the skin when the tissues begin to warm after being iced. °¨ Cold erythema. This is a red, itchy rash caused by exposure to cold. °¨ Cold hemoglobinuria. Red blood cells break down when the tissues begin to warm after being iced. The hemoglobin that carry oxygen are passed into the urine because they cannot combine with blood proteins fast enough. °· Numbness or altered sensitivity in the area being iced. °If you have any of the following conditions, do not use ice until you have discussed cryotherapy with your caregiver: °· Heart conditions, such as arrhythmia, angina, or chronic heart disease. °· High blood pressure. °· Healing wounds or open skin in the area being iced. °· Current infections. °· Rheumatoid arthritis. °· Poor circulation. °· Diabetes. °Ice slows the blood flow in the region it is applied. This is beneficial when trying to stop inflamed tissues from spreading irritating chemicals to surrounding tissues. However, if you expose your skin to cold temperatures for too long or without the proper protection, you can damage your skin or nerves. Watch for signs of skin damage due to cold. °HOME CARE INSTRUCTIONS °Follow  these tips to use ice and cold packs safely. °· Place a dry or damp towel between the ice and skin. A damp towel will cool the skin more quickly, so you may need to shorten the time that the ice is used. °· For a more rapid response, add gentle compression to the ice. °· Ice for no more than 10 to 20 minutes at a time. The bonier the area you are icing, the less time it will take to get the benefits of ice. °· Check your skin after 5 minutes to make sure there are no signs of a poor response to cold or skin damage. °· Rest 20 minutes or more between uses. °· Once your skin is numb, you can end your treatment. You can test numbness by very lightly touching your skin. The touch should be so light that you do not see the skin dimple from the pressure of your fingertip. When using ice, most people will feel these normal sensations in this order: cold, burning, aching, and numbness. °· Do not use ice on someone who cannot communicate their responses to pain, such as small children or people with dementia. °HOW TO MAKE AN ICE PACK °Ice packs are the most common way to use ice therapy. Other methods include ice massage, ice baths, and cryosprays. Muscle creams that cause a cold, tingly feeling do not offer the same benefits that ice offers and should not be used as a substitute unless recommended by your caregiver. °To make an ice pack, do one of the following: °· Place crushed ice or a   bag of frozen vegetables in a sealable plastic bag. Squeeze out the excess air. Place this bag inside another plastic bag. Slide the bag into a pillowcase or place a damp towel between your skin and the bag. °· Mix 3 parts water with 1 part rubbing alcohol. Freeze the mixture in a sealable plastic bag. When you remove the mixture from the freezer, it will be slushy. Squeeze out the excess air. Place this bag inside another plastic bag. Slide the bag into a pillowcase or place a damp towel between your skin and the bag. °SEEK MEDICAL CARE  IF: °· You develop white spots on your skin. This may give the skin a blotchy (mottled) appearance. °· Your skin turns blue or pale. °· Your skin becomes waxy or hard. °· Your swelling gets worse. °MAKE SURE YOU:  °· Understand these instructions. °· Will watch your condition. °· Will get help right away if you are not doing well or get worse. °  °This information is not intended to replace advice given to you by your health care provider. Make sure you discuss any questions you have with your health care provider. °  °Document Released: 07/21/2011 Document Revised: 12/15/2014 Document Reviewed: 07/21/2011 °Elsevier Interactive Patient Education ©2016 [employer] ° °Musculoskeletal Pain °Musculoskeletal pain is muscle and boney aches and pains. These pains can occur in any part of the body. Your caregiver may treat you without knowing the cause of the pain. They may treat you if blood or urine tests, X-rays, and other tests were normal.  °CAUSES °There is often not a definite cause or reason for these pains. These pains may be caused by a type of germ (virus). The discomfort may also come from overuse. Overuse includes working out too hard when your body is not fit. Boney aches also come from weather changes. Bone is sensitive to atmospheric pressure changes. °HOME CARE INSTRUCTIONS  °· Ask when your test results will be ready. Make sure you get your test results. °· Only take over-the-counter or prescription medicines for pain, discomfort, or fever as directed by your caregiver. If you were given medications for your condition, do not drive, operate machinery or power tools, or sign legal documents for 24 hours. Do not drink alcohol. Do not take sleeping pills or other medications that may interfere with treatment. °· Continue all activities unless the activities cause more pain. When the pain lessens, slowly resume normal activities. Gradually increase the intensity and duration of the activities or  exercise. °· During periods of severe pain, bed rest may be helpful. Lay or sit in any position that is comfortable. °· Putting ice on the injured area. °¨ Put ice in a bag. °¨ Place a towel between your skin and the bag. °¨ Leave the ice on for 15 to 20 minutes, 3 to 4 times a day. °· Follow up with your caregiver for continued problems and no reason can be found for the pain. If the pain becomes worse or does not go away, it may be necessary to repeat tests or do additional testing. Your caregiver may need to look further for a possible cause. °SEEK IMMEDIATE MEDICAL CARE IF: °· You have pain that is getting worse and is not relieved by medications. °· You develop chest pain that is associated with shortness or breath, sweating, feeling sick to your stomach (nauseous), or throw up (vomit). °· Your pain becomes localized to the abdomen. °· You develop any new symptoms that seem different or that concern   you. MAKE SURE YOU:   Understand these instructions.  Will watch your condition.  Will get help right away if you are not doing well or get worse.   This information is not intended to replace advice given to you by your health care provider. Make sure you discuss any questions you have with your health care provider.   Keep scheduled appointment with Dr. Mina MarbleWeingold for Monday. Take pain medication as needed. Return to the ED if you experience severe worsening of your symptoms, increased redness or swelling around your wound, fevers, chills.

## 2016-05-22 NOTE — ED Provider Notes (Signed)
CSN: [id number]     Arrival date & time 05/22/16  2040 History  By signing my name below, I, Majel Homereyton Lee, attest that this documentation has been prepared under the direction and in the presence of non-physician practitioner, Gaylyn RongSamantha Dowless, PA-C. Electronically Signed: Majel HomerPeyton Lee, Scribe. 05/22/2016. 8:47 PM.   No chief complaint on file.  The history is provided by the patient. No language interpreter was used.  HPI Comments:  Tim RacerKevin Jackson is a 40 y.o. male with PMHx of fibromyalgia, who presents to the Emergency Department complaining of unchanged, right hand pain s/p right hand surgery that worsened yesterday after running out of his home pain medications. He states he broke his hand on 05/11/16 and had surgery last Wednesday by [facility]. He reports his pain level is unchanged since he had surgery; however, his swelling has reduced significantly. Pt notes associated symptoms of headache and mild redness. He states he ran out of his prescribed Percocet and Oxycodone yesterday and has called his surgeon multiple times to receive more, but was not able to reach him. He reports he was given 30 pills. Pt notes he has a follow-up appointment with Dr. Mina MarbleWeingold on 05/26/16. He denies any other complaints.   Past Medical History  Diagnosis Date  . Fibromyalgia   . Psoriatic arthritis (HCC)   . History of gastric ulcer   . Closed fracture of base of metacarpal of right hand 05/10/2016    right small  . 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 Cat allergies    Past Surgical History  Procedure Laterality Date  . Orif metacarpal fracture Left   . Closed reduction metacarpal with percutaneous pinning Right 05/14/2016    Procedure: CLOSED REDUCTION METACARPAL WITH PERCUTANEOUS PINNING RIGHT SMALL METACARPAL BASE FRACTURE;  Surgeon: Dairl PonderMatthew Weingold, MD;  Location: [facility];  Service: Orthopedics;  Laterality: Right;   Family History  Problem Relation Age of Onset  . Cancer Mother   . Heart  disease Father   . Cancer Maternal Grandfather   . Cancer Paternal Grandfather    Social History  Substance Use Topics  . Smoking status: Former Smoker -- 0.00 packs/day for 0 years    Quit date: 12/07/2001  . Smokeless tobacco: Never Used  . Alcohol Use: No    Review of Systems 10 systems reviewed and all are negative for acute change except as noted in the HPI.  Allergies  Amitriptyline; Neurontin; Shellfish allergy; and Penicillins  Home Medications   Prior to Admission medications   Medication Sig Start Date End Date Taking? Authorizing Provider  albuterol (PROVENTIL HFA;VENTOLIN HFA) 108 (90 Base) MCG/ACT inhaler Inhale 1-2 puffs into the lungs every 6 (six) hours as needed for wheezing or shortness of breath. Patient not taking: Reported on 05/20/2016 04/09/16   Su Hoffarly J Rivet, MD  oxyCODONE-acetaminophen (PERCOCET/ROXICET) 5-325 MG tablet Take 1 tablet by mouth every 4 (four) hours as needed for severe pain. Patient not taking: Reported on 05/20/2016 05/10/16   Cheri FowlerKayla Rose, PA-C  oxyCODONE-acetaminophen (ROXICET) 5-325 MG tablet Take 1 tablet by mouth every 4 (four) hours as needed for severe pain. 05/14/16   Dairl PonderMatthew Weingold, MD   There were no vitals taken for this visit. Physical Exam  Constitutional: He is oriented to person, place, and time. He appears well-developed and well-nourished. No distress.  HENT:  Head: Normocephalic and atraumatic.  Eyes: Conjunctivae are normal. Right eye exhibits no discharge. Left eye exhibits no discharge. No scleral icterus.  Cardiovascular: Normal  rate.   Pulmonary/Chest: Effort normal.  Musculoskeletal:  Surgical pins in place on lateral aspect of R hand with significant TTP. No obvious swelling. No surrounding erythema, drainage from surgical wound. Wound appears CDI.   Neurological: He is alert and oriented to person, place, and time. Coordination normal.  No sensory deficits.  Skin: Skin is warm and dry. No rash noted. He is not  diaphoretic. No erythema. No pallor.  Psychiatric: He has a normal mood and affect. His behavior is normal.  Nursing note and vitals reviewed.  ED Course  Procedures  DIAGNOSTIC STUDIES:  Oxygen Saturation is 98% on RA, normal by my interpretation.    COORDINATION OF CARE:  7:15PM Discussed treatment plan, which includes Percocet and Oxycodone with pt at bedside and pt agreed to plan.  Labs Review Labs Reviewed - No data to display  Imaging Review No results found. I have personally reviewed and evaluated these images and lab results as part of my medical decision-making.   EKG Interpretation None      MDM   Final diagnoses:  Hand pain, right    Pt presents to the ED c/o R hand pain after having surgery 1 week ago. Pt ran out of his home pain medication and was unable to get in touch with his orthopedic surgeon for a refill. He has appointment with them on Monday. Surgical wounds appear to have routine healing. No sign of infection. Will provide enough pain medication until Monday when he will see orthopedic hand surgeon Dr. Mina Marble.  Case discussed with Dr. Ranae Palms who agrees with treatment plan.     I personally performed the services described in this documentation, which was scribed in my presence. The recorded information has been reviewed and is accurate.     Lester Kinsman [place], PA-C 05/23/16 1755  Loren Racer, MD 05/28/16 [phone]

## 2016-05-26 ENCOUNTER — Emergency Department
Admission: EM | Admit: 2016-05-26 | Discharge: 2016-05-26 | Disposition: A | Discharge status: Home / Self Care | Payer: PRIVATE HEALTH INSURANCE | Attending: Emergency Medicine | Admitting: Emergency Medicine

## 2016-05-26 ENCOUNTER — Emergency Department: Payer: PRIVATE HEALTH INSURANCE

## 2016-05-26 DIAGNOSIS — Z87891 Personal history of nicotine dependence: Secondary | ICD-10-CM | POA: Insufficient documentation

## 2016-05-26 DIAGNOSIS — Y999 Unspecified external cause status: Secondary | ICD-10-CM | POA: Insufficient documentation

## 2016-05-26 DIAGNOSIS — M79641 Pain in right hand: Secondary | ICD-10-CM

## 2016-05-26 DIAGNOSIS — S62309D Unspecified fracture of unspecified metacarpal bone, subsequent encounter for fracture with routine healing: Secondary | ICD-10-CM

## 2016-05-26 DIAGNOSIS — S62316D Displaced fracture of base of fifth metacarpal bone, right hand, subsequent encounter for fracture with routine healing: Secondary | ICD-10-CM | POA: Insufficient documentation

## 2016-05-26 DIAGNOSIS — X58XXXD Exposure to other specified factors, subsequent encounter: Secondary | ICD-10-CM | POA: Insufficient documentation

## 2016-05-26 DIAGNOSIS — Y929 Unspecified place or not applicable: Secondary | ICD-10-CM | POA: Insufficient documentation

## 2016-05-26 DIAGNOSIS — Y939 Activity, unspecified: Secondary | ICD-10-CM | POA: Insufficient documentation

## 2016-05-26 DIAGNOSIS — F1120 Opioid dependence, uncomplicated: Secondary | ICD-10-CM | POA: Insufficient documentation

## 2016-05-26 MED ORDER — ACETAMINOPHEN 325 MG PO TABS: 650.0000 mg | ORAL_TABLET | Freq: Four times a day (QID) | ORAL | Status: AC | PRN

## 2016-05-26 MED ORDER — OXYCODONE-ACETAMINOPHEN 5-325 MG PO TABS
1.0000 | ORAL_TABLET | Freq: Once | ORAL | Status: AC
  Administered 2016-05-26: 1 via ORAL
  Filled 2016-05-26: qty 1

## 2016-05-26 MED ORDER — NAPROXEN 500 MG PO TABS
500.0000 mg | ORAL_TABLET | Freq: Two times a day (BID) | ORAL | Status: AC
Start: 2016-05-26 — End: ?

## 2016-05-26 NOTE — ED Provider Notes (Signed)
[facility] Emergency Department Provider Note  ____________________________________________  Time seen: 12:30 PM  I have reviewed the triage vital signs and the nursing notes.   HISTORY  Chief Complaint Post-op Problem and Hand Pain    HPI Tim Jackson is a 40 y.o. male who complains of persistent right hand pain after fracturing his right fifth metacarpal and having surgical pinning of a by Dr. Mina Marble. No new injuries. No chest pain shortness of breath fever chills or sweats. He ran out of his Percocet. Review of the [place] controlled substances reporting system reveals that he's had 5 prescriptions for oxycodone containing medication in the last 2 weeks for a total of 132 5 mg tablets. He further has a history of substance abuse and heroin abuse as evidenced by ER visits to [facility] found in the electronic medical record.  Denies any other complaints. Feels worried that the pins that are protruding from his skin have moved or somehow become dislodged.   Past Medical History  Diagnosis Date  . Fibromyalgia   . Psoriatic arthritis (HCC)   . History of gastric ulcer   . Closed fracture of base of metacarpal of right hand 05/10/2016    right small  . 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 Cat allergies      Patient Active Problem List   Diagnosis Date Noted  . Moderate persistent asthma 08/24/2015  . Polyarthralgia 08/24/2015  . Diarrhea 08/24/2015  . Healthcare maintenance 08/24/2015  . Psoriasis 08/24/2015     Past Surgical History  Procedure Laterality Date  . Orif metacarpal fracture Left   . Closed reduction metacarpal with percutaneous pinning Right 05/14/2016    Procedure: CLOSED REDUCTION METACARPAL WITH PERCUTANEOUS PINNING RIGHT SMALL METACARPAL BASE FRACTURE;  Surgeon: Dairl Ponder, MD;  Location: [facility];  Service: Orthopedics;  Laterality: Right;     Current Outpatient Rx  Name  Route  Sig  Dispense  Refill  .  acetaminophen (TYLENOL) 325 MG tablet   Oral   Take 2 tablets (650 mg total) by mouth every 6 (six) hours as needed.   60 tablet   0   . albuterol (PROVENTIL HFA;VENTOLIN HFA) 108 (90 Base) MCG/ACT inhaler   Inhalation   Inhale 1-2 puffs into the lungs every 6 (six) hours as needed for wheezing or shortness of breath. Patient not taking: Reported on 05/20/2016   1 Inhaler   2   . naproxen (NAPROSYN) 500 MG tablet   Oral   Take 1 tablet (500 mg total) by mouth 2 (two) times daily with a meal.   20 tablet   0   . oxyCODONE-acetaminophen (PERCOCET/ROXICET) 5-325 MG tablet   Oral   Take 1 tablet by mouth every 4 (four) hours as needed for severe pain. Patient not taking: Reported on 05/20/2016   20 tablet   0   . oxyCODONE-acetaminophen (PERCOCET/ROXICET) 5-325 MG tablet   Oral   Take 2 tablets by mouth every 4 (four) hours as needed for severe pain.   12 tablet   0   . oxyCODONE-acetaminophen (ROXICET) 5-325 MG tablet   Oral   Take 1 tablet by mouth every 4 (four) hours as needed for severe pain.   30 tablet   0      Allergies Amitriptyline; Neurontin; Shellfish allergy; and Penicillins   Family History  Problem Relation Age of Onset  . Cancer Mother   . Heart disease Father   . Cancer Maternal Grandfather   .  Cancer Paternal Grandfather     Social History Social History  Substance Use Topics  . Smoking status: Former Smoker -- 0.00 packs/day for 0 years    Quit date: 12/07/2001  . Smokeless tobacco: Never Used  . Alcohol Use: No    Review of Systems  Constitutional:   No fever or chills.  Cardiovascular:   No chest pain. Respiratory:   No dyspnea or cough. Gastrointestinal:   Negative for abdominal pain, vomiting and diarrhea.  Musculoskeletal:  Chronic right hand pain 10-point ROS otherwise negative.  ____________________________________________   PHYSICAL EXAM:  VITAL SIGNS: ED Triage Vitals  Enc Vitals Group     BP 05/26/16 1120 144/79  mmHg     Pulse Rate 05/26/16 1120 114     Resp 05/26/16 1120 18     Temp 05/26/16 1120 98.4 F (36.9 C)     Temp Source 05/26/16 1120 Oral     SpO2 05/26/16 1120 96 %     Weight 05/26/16 1120 280 lb (127.007 kg)     Height 05/26/16 1120 6\' 1"  (1.854 m)     Head Cir --      Peak Flow --      Pain Score --      Pain Loc --      Pain Edu? --      Excl. in GC? --     Vital signs reviewed, nursing assessments reviewed.   Constitutional:   Alert and oriented. Well appearing and in no distress. Eyes:   No scleral icterus. No conjunctival pallor. PERRL. EOMI.  No nystagmus. ENT   Head:   Normocephalic and atraumatic.   Nose:   No congestion/rhinnorhea. No septal hematoma   Mouth/Throat:   MMM, no pharyngeal erythema. No peritonsillar mass.    Neck:   No stridor. No SubQ emphysema. No meningismus. Hematological/Lymphatic/Immunilogical:   No cervical lymphadenopathy. Cardiovascular:   RRR. Symmetric bilateral radial and DP pulses.  No murmurs.  Respiratory:   Normal respiratory effort without tachypnea nor retractions. Breath sounds are clear and equal bilaterally. No wheezes/rales/rhonchi. Gastrointestinal:   Soft and nontender. Non distended. There is no CVA tenderness.  No rebound, rigidity, or guarding. Normoactive bowel sounds Genitourinary:   deferred Musculoskeletal:   No deformity of swelling of the right upper extremity including the hand. Bones of the hand are nontender. There are 3 pins protruding from the skin that are fixating the right fifth metacarpal. There are not loose or dislodged, there is no soft tissue swelling or drainage or tenderness or induration or warmth in the area. No crepitus. Hartman's are soft. Neurologic:   Normal speech and language.  CN 2-10 normal. Motor grossly intact. No gross focal neurologic deficits are appreciated.  Skin:    Skin is warm, dry and intact. No rash noted.  No petechiae, purpura, or  bullae.  ____________________________________________    LABS (pertinent positives/negatives) (all labs ordered are listed, but only abnormal results are displayed) Labs Reviewed - No data to display ____________________________________________   EKG    ____________________________________________    RADIOLOGY  X-ray hand unchanged, no new injuries. Fixation pins in place  ____________________________________________   PROCEDURES   ____________________________________________   INITIAL IMPRESSION / ASSESSMENT AND PLAN / ED COURSE  Pertinent labs & imaging results that were available during my care of the patient were reviewed by me and considered in my medical decision making (see chart for details).  Patient presents with chronic right hand pain after surgery. He appears very anxious  about the location of the surgical pins. He is given a Percocet for pain control while obtaining a repeat x-ray due to his concerns about a new pop for possible dislodgment. X-rays unremarkable. Review of the controlled substance database reveals that the patient has received 132 oxycodone tablets in the last 2 weeks. I'll encourage him to follow up with orthopedics and take NSAIDs for now      ____________________________________________   FINAL CLINICAL IMPRESSION(S) / ED DIAGNOSES  Final diagnoses:  Metacarpal bone fracture, with routine healing, subsequent encounter  Pain of right hand  Opioid dependence     Portions of this note were generated with dragon dictation software. Dictation errors may occur despite best attempts at proofreading.   Sharman Cheek, MD 05/26/16 [phone]

## 2016-05-26 NOTE — ED Notes (Signed)
Patient requests percocet prescription. Reseen by MD who explains cannot prescribe more narcotics at this time. Pt becomes agitated and leaves without prescriptions or instructions.

## 2016-05-26 NOTE — ED Notes (Signed)
Pt c/o increased pain at the site of surgical pins in the right hand, pt is upset and has pressured speech, states he will not go back to his surgeon, states he was c/o pain when he called them today  And they will not prescribe him and more pain meds,..Tim Jackson

## 2016-05-26 NOTE — Discharge Instructions (Signed)

## 2016-05-27 ENCOUNTER — Encounter: Payer: Self-pay | Admitting: Emergency Medicine

## 2016-05-27 ENCOUNTER — Emergency Department
Admission: EM | Admit: 2016-05-27 | Discharge: 2016-05-27 | Disposition: A | Discharge status: Home / Self Care | Payer: PRIVATE HEALTH INSURANCE | Attending: Emergency Medicine | Admitting: Emergency Medicine

## 2016-05-27 DIAGNOSIS — Z91013 Allergy to seafood: Secondary | ICD-10-CM | POA: Insufficient documentation

## 2016-05-27 DIAGNOSIS — Z87891 Personal history of nicotine dependence: Secondary | ICD-10-CM | POA: Insufficient documentation

## 2016-05-27 DIAGNOSIS — G8918 Other acute postprocedural pain: Secondary | ICD-10-CM | POA: Insufficient documentation

## 2016-05-27 MED ORDER — HYDROMORPHONE HCL 1 MG/ML IJ SOLN
1.0000 mg | Freq: Once | INTRAMUSCULAR | Status: AC
  Administered 2016-05-27: 1 mg via INTRAMUSCULAR
  Filled 2016-05-27: qty 1

## 2016-05-27 MED ORDER — OXYCODONE-ACETAMINOPHEN 5-325 MG PO TABS
1.0000 | ORAL_TABLET | ORAL | Status: DC | PRN
  Administered 2016-05-27: 1 via ORAL
  Filled 2016-05-27: qty 1

## 2016-05-27 MED ORDER — OXYCODONE-ACETAMINOPHEN 5-325 MG PO TABS: 1.0000 | ORAL_TABLET | ORAL | Status: AC | PRN

## 2016-05-27 MED ORDER — SULFAMETHOXAZOLE-TRIMETHOPRIM 800-160 MG PO TABS: 1.0000 | ORAL_TABLET | Freq: Two times a day (BID) | ORAL | Status: AC

## 2016-05-27 MED ORDER — SULFAMETHOXAZOLE-TRIMETHOPRIM 800-160 MG PO TABS
1.0000 | ORAL_TABLET | Freq: Once | ORAL | Status: AC
  Administered 2016-05-27: 1 via ORAL
  Filled 2016-05-27: qty 1

## 2016-05-27 NOTE — Discharge Instructions (Signed)
Take bactrim twice daily for a week.   Take motrin for pain.  Take percocet for severe pain.   Go to Dr. Ronie SpiesWeingold's office for therapy session today and then make a follow up appointment   Return to ER if you have worse purulent discharge, severe pain, fevers.

## 2016-05-27 NOTE — ED Notes (Signed)
Patient presents to the ED post 5th metacarpal surgery.  At surgery site there are 3 metal hooks that are at differing lengths.  Patient states, "they are all supposed to be the same.  My hand feels like it's going to explode and if I squeeze it, there is puss coming out."  Area appears slightly reddened and swollen.  Patient states the surgery was 3 weeks ago.  Patient appears very uncomfortable.

## 2016-05-27 NOTE — ED Provider Notes (Signed)
CSN: [id number]     Arrival date & time 05/27/16  4098 History   First MD Initiated Contact with Patient 05/27/16 [phone]     Chief Complaint  Patient presents with  . Post-op Problem     (Consider location/radiation/quality/duration/timing/severity/associated sxs/prior Treatment) The history is provided by the patient.  Tim Jackson is a 40 y.o. male hx of gastric ulcer, fibromyalgia, here with R hand pain. Has intra articular hand fracture 3 weeks ago and s/p pin on 6/7. He has been in the ED multiple times for pain related issues. Had filled about 130 percocets recently. Seen on 6/15 for pain, and also yesterday for pain and swelling. xrays yesterday showed that hardware is in place. States that there is some swelling and some purulent discharge when he squeezes his hand. He called Dr. Ronie Spies office and didn't get a response and refuses to follow up there. Denies fever.    Past Medical History  Diagnosis Date  . Fibromyalgia   . Psoriatic arthritis (HCC)   . History of gastric ulcer   . Closed fracture of base of metacarpal of right hand 05/10/2016    right small  . 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 Cat allergies    Past Surgical History  Procedure Laterality Date  . Orif metacarpal fracture Left   . Closed reduction metacarpal with percutaneous pinning Right 05/14/2016    Procedure: CLOSED REDUCTION METACARPAL WITH PERCUTANEOUS PINNING RIGHT SMALL METACARPAL BASE FRACTURE;  Surgeon: Dairl Ponder, MD;  Location: [facility];  Service: Orthopedics;  Laterality: Right;   Family History  Problem Relation Age of Onset  . Cancer Mother   . Heart disease Father   . Cancer Maternal Grandfather   . Cancer Paternal Grandfather    Social History  Substance Use Topics  . Smoking status: Former Smoker -- 0.00 packs/day for 0 years    Quit date: 12/07/2001  . Smokeless tobacco: Never Used  . Alcohol Use: No    Review of Systems  Musculoskeletal:       R hand pain   All  other systems reviewed and are negative.     Allergies  Amitriptyline; Neurontin; Shellfish allergy; and Penicillins  Home Medications   Prior to Admission medications   Medication Sig Start Date End Date Taking? Authorizing Provider  acetaminophen (TYLENOL) 325 MG tablet Take 2 tablets (650 mg total) by mouth every 6 (six) hours as needed. 05/26/16   Sharman Cheek, MD  albuterol (PROVENTIL HFA;VENTOLIN HFA) 108 (90 Base) MCG/ACT inhaler Inhale 1-2 puffs into the lungs every 6 (six) hours as needed for wheezing or shortness of breath. Patient not taking: Reported on 05/20/2016 04/09/16   Su Hoff, MD  naproxen (NAPROSYN) 500 MG tablet Take 1 tablet (500 mg total) by mouth 2 (two) times daily with a meal. 05/26/16   Sharman Cheek, MD  oxyCODONE-acetaminophen (PERCOCET/ROXICET) 5-325 MG tablet Take 1 tablet by mouth every 4 (four) hours as needed for severe pain. Patient not taking: Reported on 05/20/2016 05/10/16   Cheri Fowler, PA-C  oxyCODONE-acetaminophen (PERCOCET/ROXICET) 5-325 MG tablet Take 2 tablets by mouth every 4 (four) hours as needed for severe pain. 05/22/16   Samantha Tripp Dowless, PA-C  oxyCODONE-acetaminophen (ROXICET) 5-325 MG tablet Take 1 tablet by mouth every 4 (four) hours as needed for severe pain. 05/14/16   Dairl Ponder, MD   BP 141/82 mmHg  Pulse 118  Temp(Src) 98.3 F (36.8 C) (Oral)  Resp 18  Ht  (1.854  m)  Wt 280 lb (127.007 kg)  BMI 36.95 kg/m2  SpO2 95% Physical Exam  Constitutional: He is oriented to person, place, and time.  Uncomfortable   HENT:  Head: Normocephalic.  Eyes: Pupils are equal, round, and reactive to light.  Neck: Normal range of motion.  Cardiovascular: Normal rate, regular rhythm and normal heart sounds.   Pulmonary/Chest: Effort normal.  Abdominal: Soft. Bowel sounds are normal.  Musculoskeletal:  R hand swollen around the 3 pins, no obvious purulent discharge. Pulses intact. No obvious purulent discharge    Neurological: He is alert and oriented to person, place, and time.  Skin: Skin is warm and dry.  Psychiatric: He has a normal mood and affect. His behavior is normal. Judgment and thought content normal.  Nursing note and vitals reviewed.   ED Course  Procedures (including critical care time) Labs Review Labs Reviewed - No data to display  Imaging Review Dg Hand 2 View Right  05/26/2016  CLINICAL DATA:  Recent surgery 2 weeks ago.  Hit hand today.  Pain. EXAM: RIGHT HAND - 2 VIEW COMPARISON:  05/10/2016 FINDINGS: 3 pins are noted across the proximal right fifth metacarpal fracture. Fracture lines remain evident. Alignment is stable. No new bony abnormality. IMPRESSION: Comminuted fracture through the proximal right fifth metacarpal with 3 pins in place. No change in appearance or alignment since prior study. Electronically Signed   By: Charlett NoseKevin  Dover M.D.   On: 05/26/2016 13:24   I have personally reviewed and evaluated these images and lab results as part of my medical decision-making.   EKG Interpretation None      MDM   Final diagnoses:  None   Tim Jackson is a 40 y.o. male here with R hand pain and swelling around the pin site. Maybe inflammation vs early infection. Afebrile in the ED. Refuses to follow up with Dr. Mina MarbleWeingold. Will consult ortho for second opinion.   9:01 AM Xray from yesterday reviewed and showed that pin is in place. Tachycardic likely from pain. Called Dr. Joice LoftsPoggi from ortho, who is ok with antibiotics. He states that patient needs to follow up with Dr. Mina MarbleWeingold instead of him as Dr. Mina MarbleWeingold did the surgery and that its too soon to take the pin out. I called Dr. Ronie SpiesWeingold's office who states that he actually canceled his appointment on 6/13 and has therapy appointment today at 4pm. Will give bus pass to go to [place]. Will dc home with 5 percocets, bactrim.   Tim Canalavid H Yao, MD 05/27/16 [phone]

## 2016-06-02 NOTE — Addendum Note (Signed)
Addended by: Neomia DearPOWERS, Asjah Rauda E on: 06/02/2016 06:41 PM   Modules accepted: Orders

## 2016-06-03 ENCOUNTER — Emergency Department
Admission: EM | Admit: 2016-06-03 | Discharge: 2016-06-03 | Disposition: A | Discharge status: Home / Self Care | Payer: PRIVATE HEALTH INSURANCE | Attending: Emergency Medicine | Admitting: Emergency Medicine

## 2016-06-03 ENCOUNTER — Emergency Department: Payer: PRIVATE HEALTH INSURANCE

## 2016-06-03 DIAGNOSIS — Z79899 Other long term (current) drug therapy: Secondary | ICD-10-CM | POA: Insufficient documentation

## 2016-06-03 DIAGNOSIS — G8918 Other acute postprocedural pain: Secondary | ICD-10-CM | POA: Insufficient documentation

## 2016-06-03 DIAGNOSIS — Z91013 Allergy to seafood: Secondary | ICD-10-CM | POA: Insufficient documentation

## 2016-06-03 DIAGNOSIS — Z87891 Personal history of nicotine dependence: Secondary | ICD-10-CM | POA: Insufficient documentation

## 2016-06-03 DIAGNOSIS — Z792 Long term (current) use of antibiotics: Secondary | ICD-10-CM | POA: Insufficient documentation

## 2016-06-03 DIAGNOSIS — L4050 Arthropathic psoriasis, unspecified: Secondary | ICD-10-CM | POA: Insufficient documentation

## 2016-06-03 LAB — CBC
HCT: 41.2 % (ref 40.0–52.0)
HEMOGLOBIN: 14.3 g/dL (ref 13.0–18.0)
MCH: 31.1 pg (ref 26.0–34.0)
MCHC: 34.8 g/dL (ref 32.0–36.0)
MCV: 89.4 fL (ref 80.0–100.0)
Platelets: 269 10*3/uL (ref 150–440)
RBC: 4.61 MIL/uL (ref 4.40–5.90)
RDW: 13.8 % (ref 11.5–14.5)
WBC: 9.6 10*3/uL (ref 3.8–10.6)

## 2016-06-03 LAB — COMPREHENSIVE METABOLIC PANEL
ALK PHOS: 73 U/L (ref 38–126)
ALT: 32 U/L (ref 17–63)
AST: 35 U/L (ref 15–41)
Albumin: 4.3 g/dL (ref 3.5–5.0)
Anion gap: 8 (ref 5–15)
BUN: 14 mg/dL (ref 6–20)
CALCIUM: 8.9 mg/dL (ref 8.9–10.3)
CO2: 26 mmol/L (ref 22–32)
CREATININE: 1.18 mg/dL (ref 0.61–1.24)
Chloride: 105 mmol/L (ref 101–111)
GFR calc non Af Amer: >60 mL/min (ref >60)
Glucose, Bld: 94 mg/dL (ref 65–99)
Potassium: 4.3 mmol/L (ref 3.5–5.1)
SODIUM: 139 mmol/L (ref 135–145)
Total Bilirubin: 0.7 mg/dL (ref 0.3–1.2)
Total Protein: 7.3 g/dL (ref 6.5–8.1)

## 2016-06-03 MED ORDER — MORPHINE SULFATE (PF) 4 MG/ML IV SOLN
INTRAVENOUS | Status: AC
  Administered 2016-06-03: 4 mg via INTRAMUSCULAR
  Filled 2016-06-03: qty 1

## 2016-06-03 MED ORDER — MORPHINE SULFATE (PF) 4 MG/ML IV SOLN
4.0000 mg | Freq: Once | INTRAVENOUS | Status: AC
  Administered 2016-06-03: 4 mg via INTRAMUSCULAR

## 2016-06-03 MED ORDER — HYDROMORPHONE HCL 1 MG/ML IJ SOLN: 1.0000 mg | Freq: Once | INTRAMUSCULAR | Status: DC

## 2016-06-03 MED ORDER — CLINDAMYCIN HCL 300 MG PO CAPS: 300.0000 mg | ORAL_CAPSULE | Freq: Three times a day (TID) | ORAL | Status: AC

## 2016-06-03 NOTE — ED Provider Notes (Signed)
[facility] Emergency Department Provider Note  Time seen: 6:14 PM  I have reviewed the triage vital signs and the nursing notes.   HISTORY  Chief Complaint Medication Refill and post op hand surgery     HPI Tim Jackson is a 40 y.o. male with a past medical history of fibromyalgia, arthritis, recent right fifth metacarpal fracture status post pinning, who presents to the emergency department increased pain and redness to the right hand. According to the patient approximately one month ago he broke his right hand (fifth metacarpal). [facility] with [facility] placed at the beginning of June. One of the pins had to be removed per patient. He states the pain has been constant and severe since the initial procedure. He tried to follow up with his orthopedist today but he says they refused to see him as he was them money. Patient still has 2 pins in place and does not know what to do at this point. Patient states he finished a seven-day course of Bactrim but continues to have pain with mild redness so he came to the emergency department for evaluation.     Past Medical History  Diagnosis Date  . Fibromyalgia   . Psoriatic arthritis (HCC)   . History of gastric ulcer   . Closed fracture of base of metacarpal of right hand 05/10/2016    right small  . 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 Cat allergies     Patient Active Problem List   Diagnosis Date Noted  . Moderate persistent asthma 08/24/2015  . Polyarthralgia 08/24/2015  . Diarrhea 08/24/2015  . Healthcare maintenance 08/24/2015  . Psoriasis 08/24/2015    Past Surgical History  Procedure Laterality Date  . Orif metacarpal fracture Left   . Closed reduction metacarpal with percutaneous pinning Right 05/14/2016    Procedure: CLOSED REDUCTION METACARPAL WITH PERCUTANEOUS PINNING RIGHT SMALL METACARPAL BASE FRACTURE;  Surgeon: Dairl PonderMatthew Weingold, MD;  Location: [facility];   Service: Orthopedics;  Laterality: Right;    Current Outpatient Rx  Name  Route  Sig  Dispense  Refill  . acetaminophen (TYLENOL) 325 MG tablet   Oral   Take 2 tablets (650 mg total) by mouth every 6 (six) hours as needed.   60 tablet   0   . albuterol (PROVENTIL HFA;VENTOLIN HFA) 108 (90 Base) MCG/ACT inhaler   Inhalation   Inhale 1-2 puffs into the lungs every 6 (six) hours as needed for wheezing or shortness of breath. Patient not taking: Reported on 05/20/2016   1 Inhaler   2   . naproxen (NAPROSYN) 500 MG tablet   Oral   Take 1 tablet (500 mg total) by mouth 2 (two) times daily with a meal.   20 tablet   0   . oxyCODONE-acetaminophen (ROXICET) 5-325 MG tablet   Oral   Take 1 tablet by mouth every 4 (four) hours as needed for severe pain.   5 tablet   0   . sulfamethoxazole-trimethoprim (BACTRIM DS,SEPTRA DS) 800-160 MG tablet   Oral   Take 1 tablet by mouth 2 (two) times daily.   15 tablet   0     Allergies Amitriptyline; Neurontin; Shellfish allergy; and Penicillins  Family History  Problem Relation Age of Onset  . Cancer Mother   . Heart disease Father   . Cancer Maternal Grandfather   . Cancer Paternal Grandfather     Social History Social History  Substance  Use Topics  . Smoking status: Former Smoker -- 0.00 packs/day for 0 years    Quit date: 12/07/2001  . Smokeless tobacco: Never Used  . Alcohol Use: No    Review of Systems Constitutional: Negative for fever. Cardiovascular: Negative for chest pain. Respiratory: Negative for shortness of breath. Gastrointestinal: Negative for abdominal pain, vomiting  Musculoskeletal: Right hand pain Skin: Redness to right hand. Neurological: Negative for headache 10-point ROS otherwise negative.  ____________________________________________   PHYSICAL EXAM:  VITAL SIGNS: ED Triage Vitals  Enc Vitals Group     BP 06/03/16 1641 125/65 mmHg     Pulse Rate 06/03/16 1641 84     Resp 06/03/16 1641 20      Temp 06/03/16 1641 98.2 F (36.8 C)     Temp src --      SpO2 06/03/16 1641 98 %     Weight 06/03/16 1641 280 lb (127.007 kg)     Height 06/03/16 1641  (1.854 m)     Head Cir --      Peak Flow --      Pain Score --      Pain Loc --      Pain Edu? --      Excl. in GC? --     Constitutional: Alert and oriented. Well appearing and in no distress. Eyes: Normal exam ENT   Head: Normocephalic and atraumatic.   Mouth/Throat: Mucous membranes are moist. Cardiovascular: Normal rate, regular rhythm. No murmur Respiratory: Normal respiratory effort without tachypnea nor retractions. Breath sounds are clear  Gastrointestinal: Soft and nontender. No distention.   Musculoskeletal: Mild swelling to the right hand, small 1.5 cm diameter area of erythema to the medial aspect of the right hand around the pin insertion site. No pus or drainage. Moderate tenderness palpation of the entire area. Neurologic:  Normal speech and language. No gross focal neurologic deficits  Skin:  Skin is warm, dry. Mild erythema surrounding pin insertion site as described above. Psychiatric: Mood and affect are normal.  ____________________________________________     RADIOLOGY  Hand x-ray shows early stages of healing.  ____________________________________________    INITIAL IMPRESSION / ASSESSMENT AND PLAN / ED COURSE  Pertinent labs & imaging results that were available during my care of the patient were reviewed by me and considered in my medical decision making (see chart for details).  The patient presents the emergency department with continued pain, and concern for infection around the pin insertion site of the right hand. The insertion site does show a mild amount of erythema 1.5 cm around the pins. Moderate tenderness to palpation with mild edema. We'll obtain an x-ray, labs, and attempt to discuss the patient with Patrcia Dolly, orthopedics. Overall the patient appears well. No fever area and  vitals are within normal limits. I do not suspect significant infection and if any.  I did discuss with the patient that I would not be refilling his pain medication as he has had over 130 narcotic pills filled in the past 3 weeks.  Hand x-ray shows early stages of healing. Labs are within normal limits. Vitals are normal. White blood cell count is normal. I'm currently attempting to reach the orthopedist on call for Dr. Mina Marble to discuss further management for the patient.  I discussed the patient with Dr. Merlyn Lot, who is on-call for Dr. Mina Marble. States the patient was listed as a no show for his appointment today. They be happy to see the patient in clinic, patient is to call  the referral line for the next available appointment. He would like the patient discharged on antibiotic, we'll send him home on clindamycin in case there is any small infection remaining. Patient is agreeable to this plan.  ____________________________________________   FINAL CLINICAL IMPRESSION(S) / ED DIAGNOSES  Right hand pain Postsurgical pain   Minna AntisKevin Edmund Rick, MD 06/03/16 2026

## 2016-06-03 NOTE — ED Notes (Signed)
Pt c/o right hand pain, pt has external fixators in place from prior surgery,, pt has been here numerous times since having surgery and refusing to follow up with the surgeon..Marland Kitchen

## 2016-06-03 NOTE — ED Notes (Signed)

## 2016-06-03 NOTE — Discharge Instructions (Signed)
Pain Relief Preoperatively and Postoperatively  If you have questions, problems, or concerns about the pain that you may feel after surgery, let your health care provider know. Patients have the right to assessment and management of pain. Severe pain after surgery--and the fear or anxiety associated with that pain--may cause extreme discomfort that:  · Prevents sleep.  · Decreases the ability to breathe deeply and to cough. This can result in pneumonia or other upper airway infections.  · Causes the heart to beat more quickly and the blood pressure to be higher.  · Increases the risk for constipation and bloating.  · Decreases the ability of wounds to heal.  · May result in depression, increased anxiety, and feelings of helplessness.  Relieving pain before surgery (preoperatively) is also important because it lessens pain that you have after surgery (postoperatively). Patients who receive pain relief both before and after surgery experience greater pain relief than those who receive pain relief only after surgery. Let your health care provider know if you are having uncontrolled pain. This is very important. Pain after surgery is more difficult to manage if it is severe, so receiving prompt and adequate treatment of acute pain is necessary. If you become constipated after taking pain medicine, drink more liquids if you can. Your health care provider may have you take a mild laxative.  PAIN CONTROL METHODS  Your health care providers follow policies and procedures about the management of your pain. These guidelines should be explained to you before surgery. Plans for pain control after surgery must be decided upon by you and your health care provider and put into use with your full understanding and agreement. Do not be afraid to ask questions about the care that you are receiving.  Your health care providers will attempt to control your pain in various ways, and these methods may be used together (multimodal  analgesia). Using this approach has many benefits for you, including being able to eat, move around, and leave the hospital sooner.  As-Needed Pain Control  · You may be given pain medicine through an IV tube or as a pill or liquid that you can swallow. Let your health care provider know when you are having pain, and he or she will give you the pain medicine that is ordered for you.  IV Patient-Controlled Analgesia (PCA) Pump  · You can receive your pain medicine through an IV tube that goes into one of your veins. You can control the amount of pain medicine that you get. The pain medicine is controlled by a pump. When you push the button that is hooked up to this pump, you receive a specific amount of pain medicine. This button should be pushed only by you or by someone who is specifically assigned by you to do so. It is set up to keep you from accidentally giving yourself too much pain medicine. You will be able to start using your pain pump in the recovery room after your surgery. This method can be helpful for most types of surgery.  · Tell your health care provider:    If you are having too much pain.    If you are feeling too sleepy or nauseous.  Continuous Epidural Pain Control  · A thin, soft tube (catheter) is put into your back, outside the outer layer of your spinal cord. Pain medicine flows through the catheter to lessen pain in areas of your body that are below the level of catheter placement. Continuous epidural pain control may work best for you   if you are having surgery on your abdomen, hip area, or legs. The epidural catheter is usually put into your back shortly before surgery. It is left in until you can eat, take medicine by mouth, pass urine, and have a bowel movement.  · Giving pain medicine through the epidural catheter may help you to heal more quickly because you can do these things sooner:    Regain normal bowel and bladder function.    Return to eating.    Get up and walk.  Medicine That  Numbs the Area (Local Anesthetic)  You may be given pain medicine:  · As an injection near the area of the pain (local infiltration).  · As an injection near the nerve that controls the sensation to a specific part of your body (peripheral nerve block).  · In your spine to block pain (spinal block).  · Through a local anesthetic reservoir pump. If your surgeon or anesthesiologist selects this option as a part of your pain control, one or more thin, soft tubes will be inserted into your incision site(s) at the end of surgery. These tubes will be connected to a device that is filled with a non-narcotic pain medicine. This medicine gradually empties into your incision site over the next several days. Usually, after all of the medicine is used, your health care provider will remove the tubes and throw away the device.  Opioids  · Moderate to moderately severe acute pain after surgery may respond to opioids. Opioids are narcotic pain medicine. Opioids are often combined with non-narcotic medicines to improve pain relief, lower the risk of side effects, and reduce the chance of addiction.  · If you follow your health care provider's directions about taking opioids and you do not have a history of substance abuse, your risk of becoming addicted is very small. To prevent addiction, opioids are given for short periods of time in careful doses.  Other Methods of Pain Control  · Steroids.  · Physical therapy.  · Heat and cold therapy.  · Compression, such as wrapping an elastic bandage around the area of the pain.  · Massage.     This information is not intended to replace advice given to you by your health care provider. Make sure you discuss any questions you have with your health care provider.     Document Released: 02/14/2003 Document Revised: 12/15/2014 Document Reviewed: 02/18/2011  Elsevier Interactive Patient Education ©2016 [employer]

## 2016-06-04 ENCOUNTER — Ambulatory Visit: Payer: Self-pay | Admitting: Occupational Therapy

## 2016-10-28 ENCOUNTER — Encounter: Payer: Self-pay | Admitting: Occupational Therapy

## 2016-10-28 NOTE — Therapy (Signed)
[facility] [address] [place], [place], [address] Phone: [phone]   Fax:  [phone]  Patient Details  Name: Tim Jackson MRN: [id number] Date of Birth: 04/14/1976 Referring Provider: Dr. Dairl PonderMatthew Weingold  Encounter Date: 10/28/2016  Pt did not return after initial evaluation on 05/20/16 therefore will d/c episode of care at this time.    Kelli ChurnBallie, Rillie Riffel Johnson, OTR/L 10/28/2016, 8:10 AM  [facility] [address] [place], [place], [address] Phone: [phone]   Fax:  [phone]

## 2018-03-15 IMAGING — DX DG HAND 2V*R*
2 series · 2 of 2 positions shown · non-contrast
Comparison: 05/10/2016

CLINICAL DATA: Recent surgery 2 weeks ago.  Hit hand today.  Pain.

EXAM:
RIGHT HAND - 2 VIEW

[hand ap]
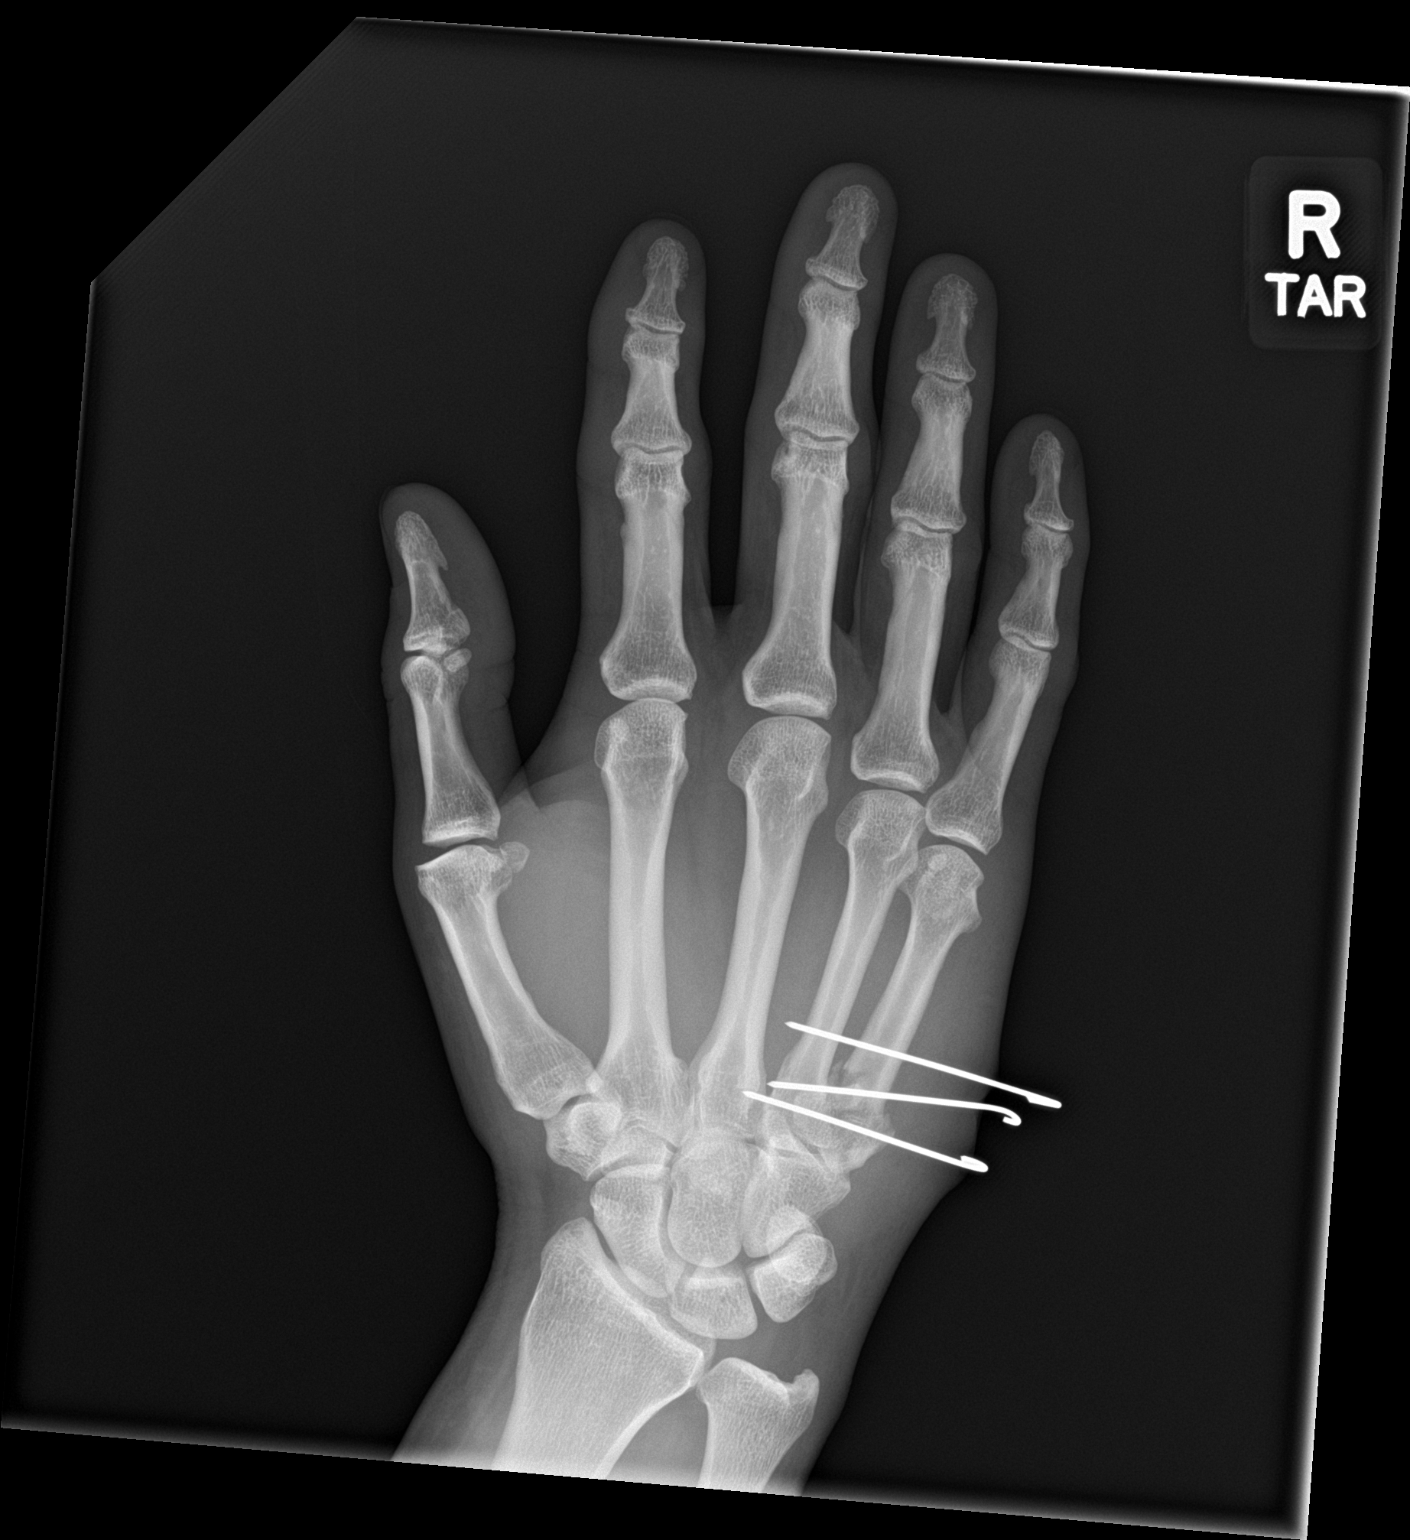

[hand lat]
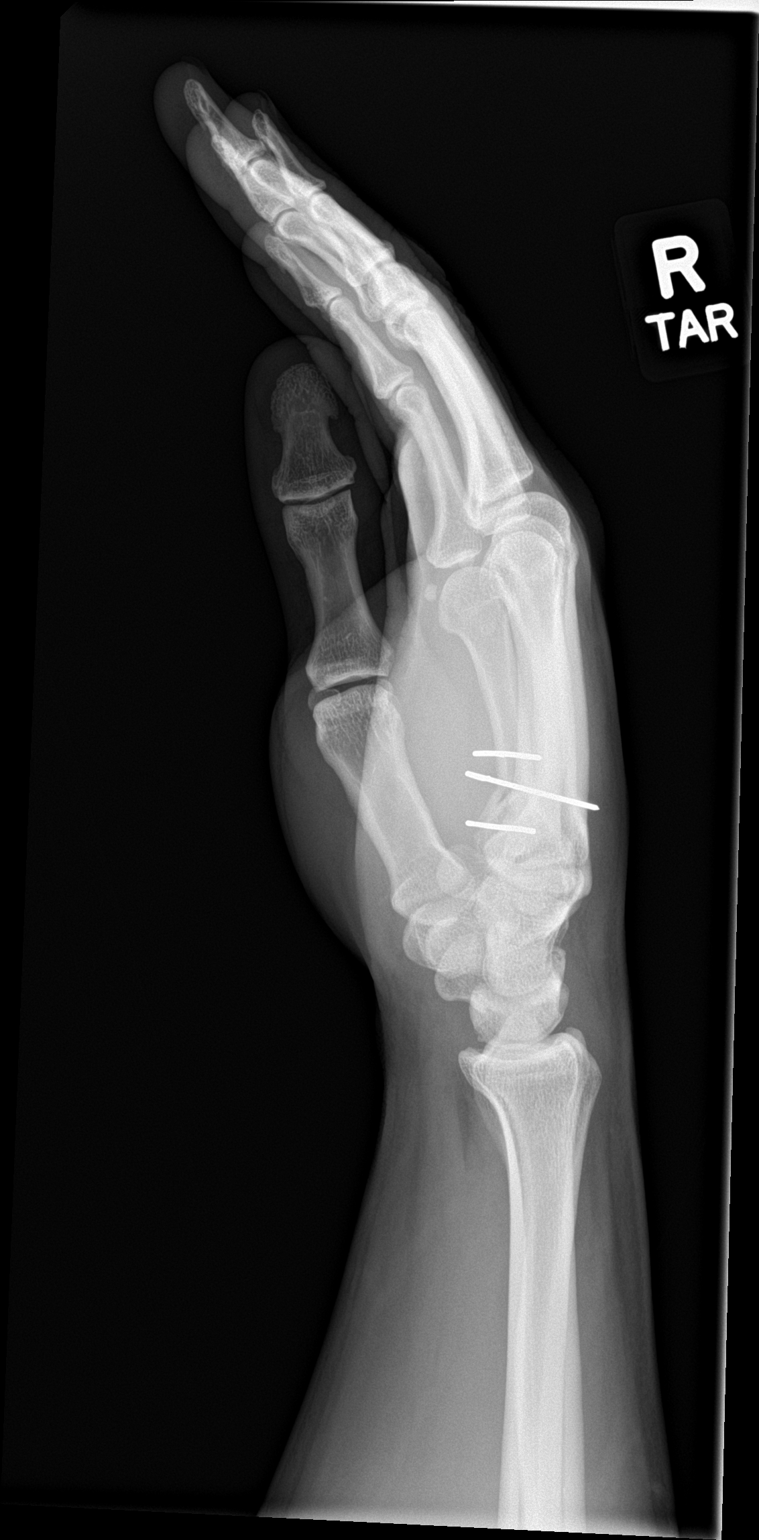

[2 of 2 positions shown; findings below may reference images not displayed]

FINDINGS: 3 pins are noted across the proximal right fifth metacarpal
fracture. Fracture lines remain evident. Alignment is stable. No new
bony abnormality.
IMPRESSION: Comminuted fracture through the proximal right fifth metacarpal with
3 pins in place. No change in appearance or alignment since prior
study.

## 2018-03-23 IMAGING — DX DG HAND COMPLETE 3+V*R*
3 series · 3 of 3 positions shown · non-contrast
Comparison: 05/26/2016

CLINICAL DATA: Right hand pain. Previous surgical repair of
proximal fifth metacarpal fracture. Subsequent encounter.

EXAM:
RIGHT HAND - COMPLETE 3+ VIEW

[hand ap]
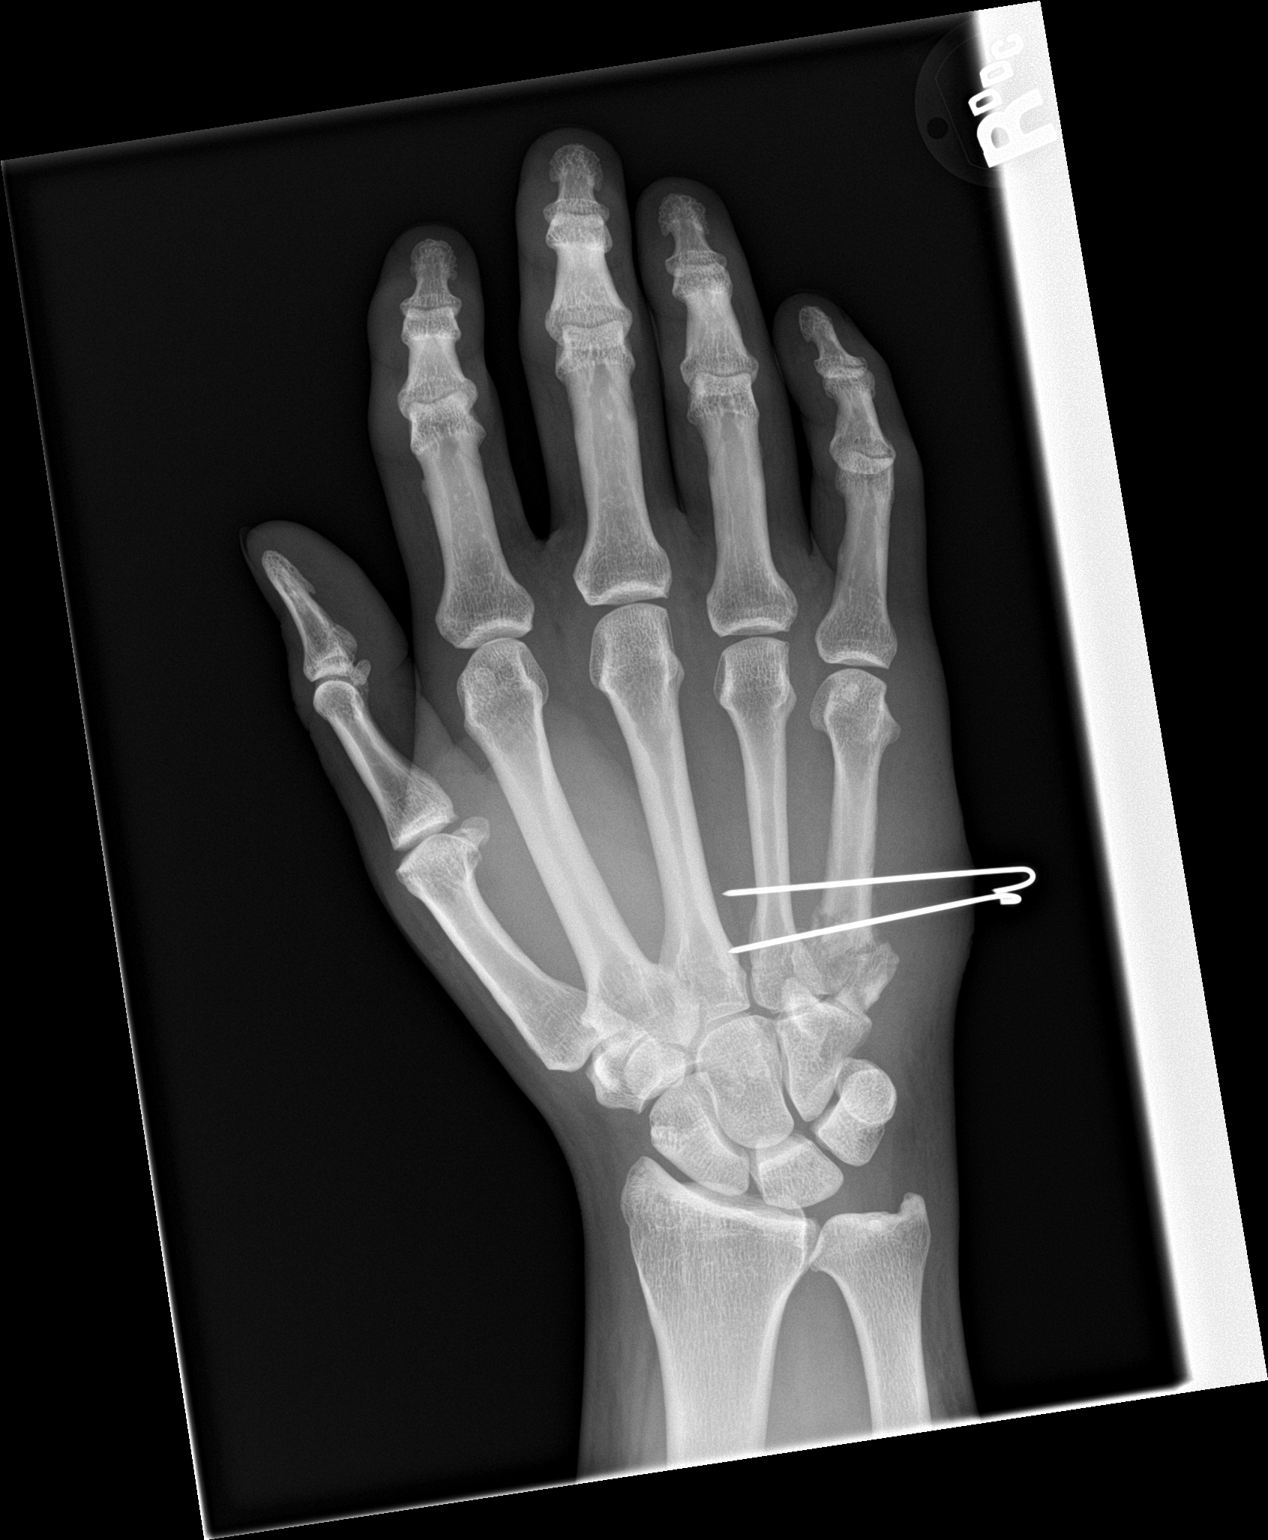

[hand lat]
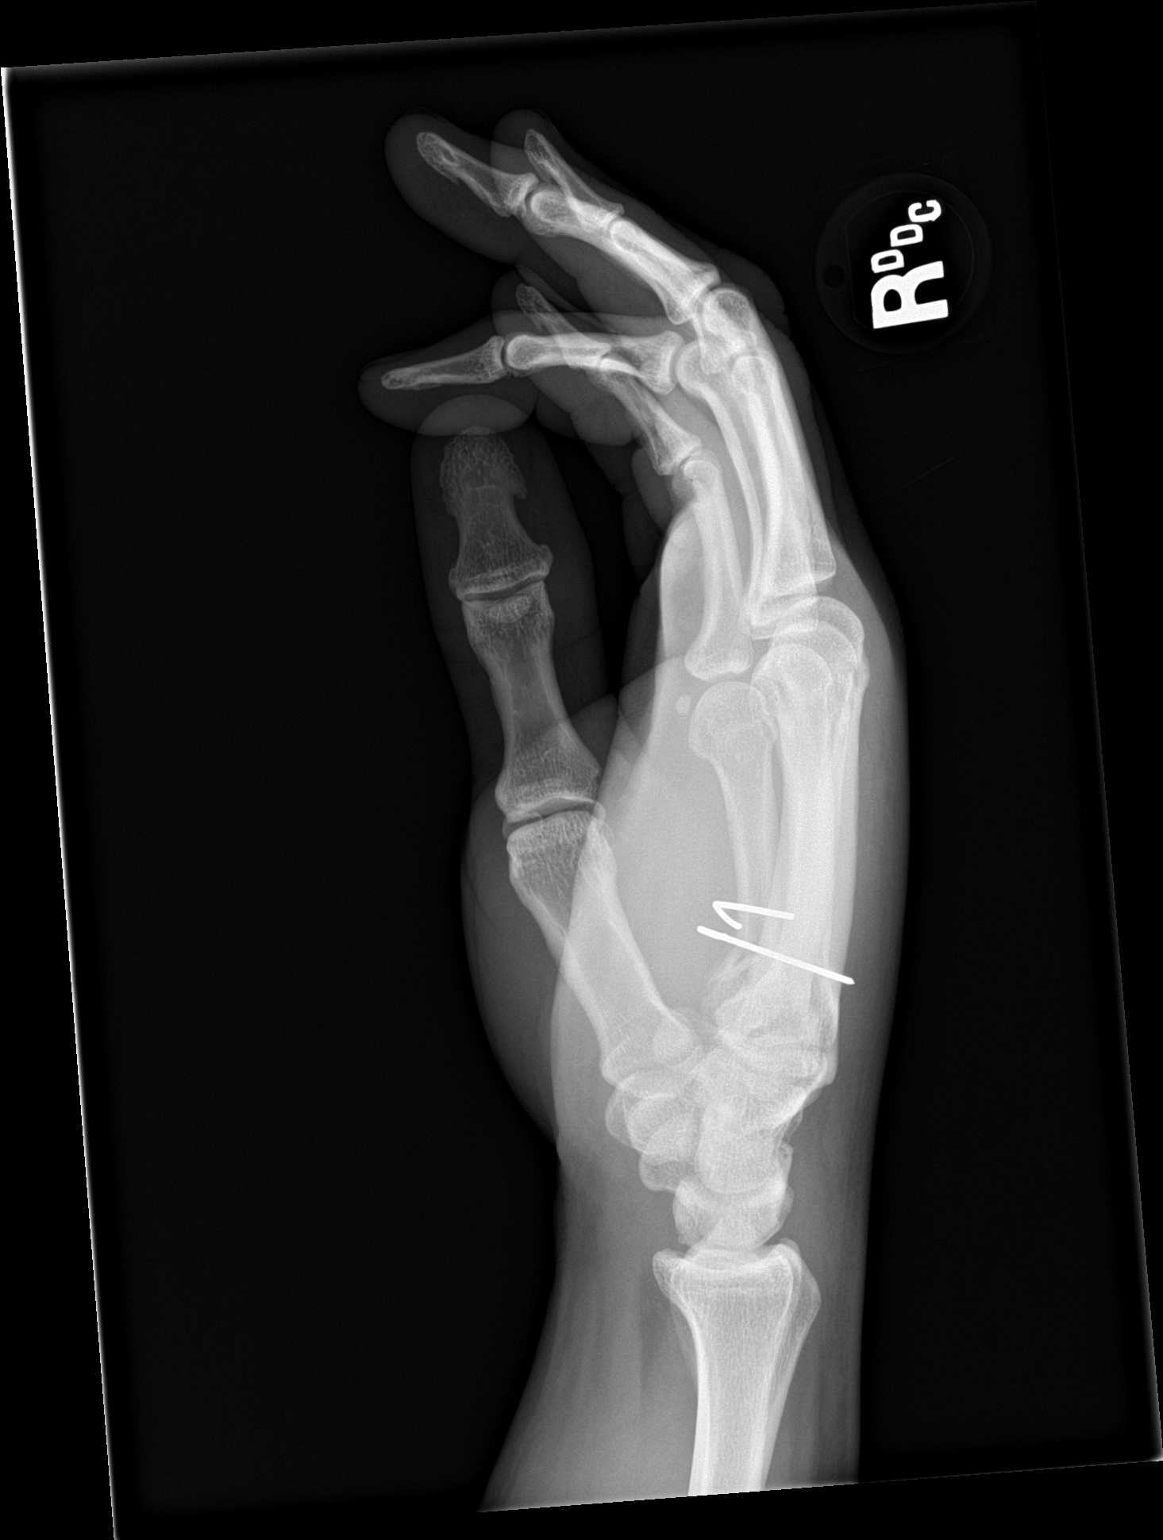

[hand obl]
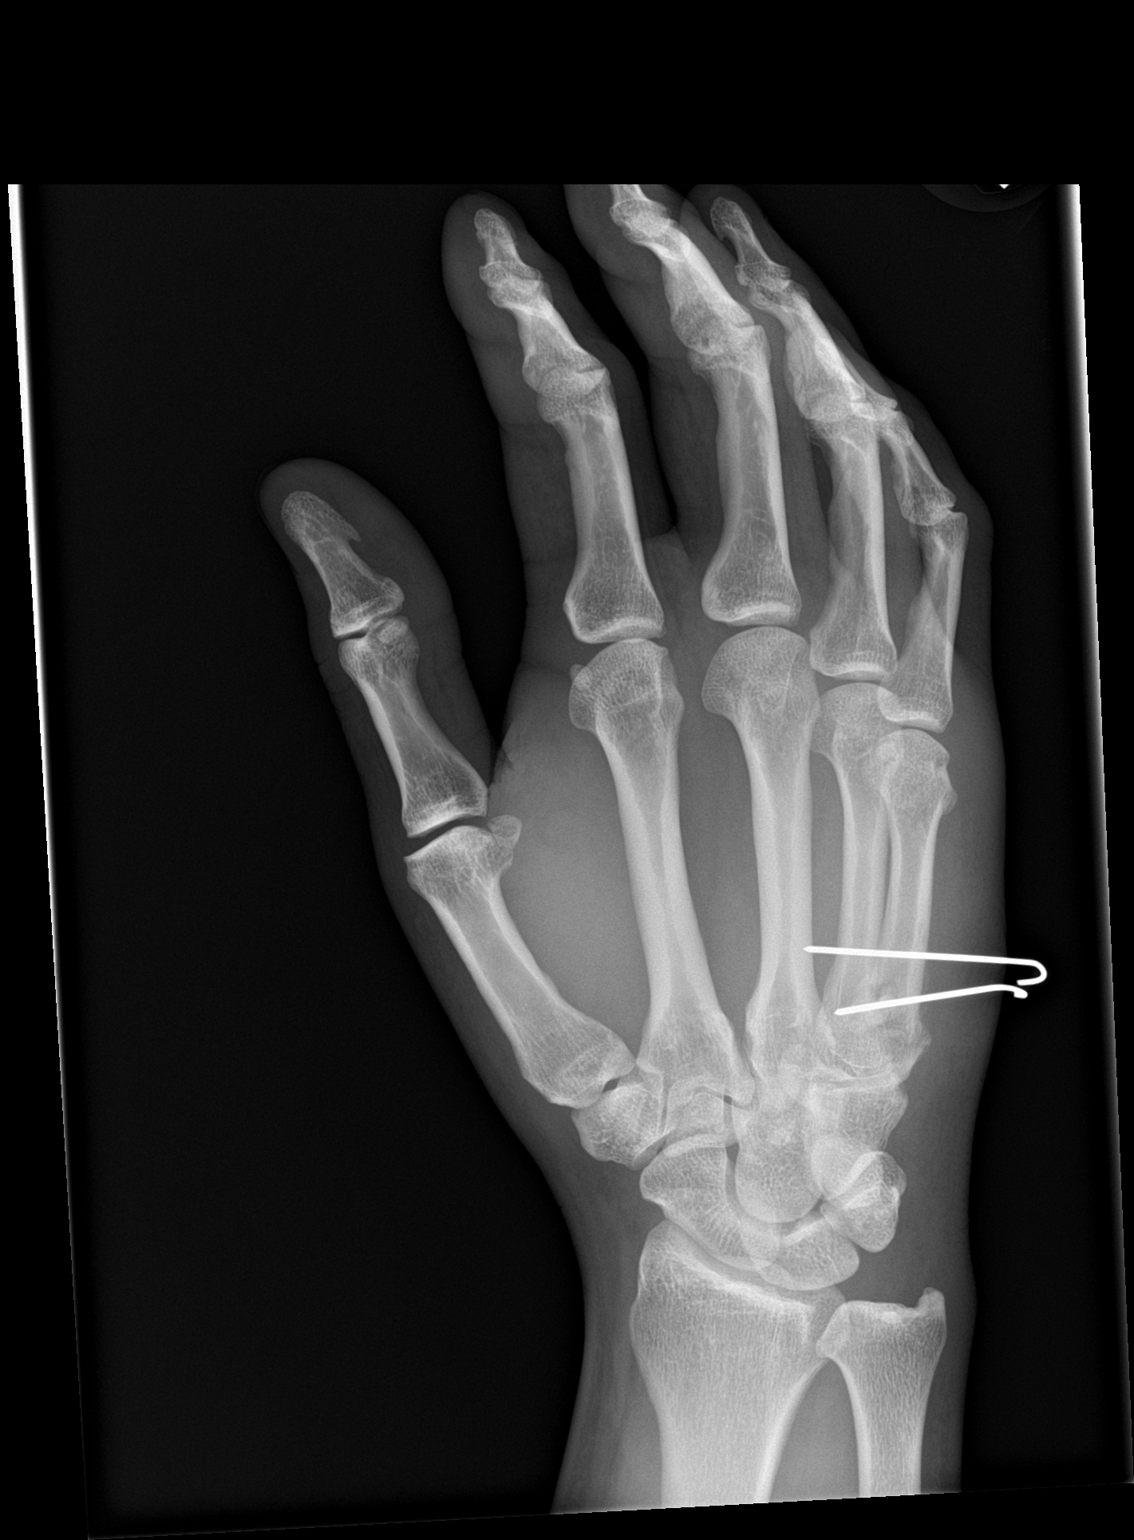

[3 of 3 positions shown; findings below may reference images not displayed]

FINDINGS: Comminuted fracture involving the base of the fifth metacarpal is
again seen. This remains in near anatomic alignment. Fracture
margins are less distinct with probable early periosteal reaction,
consistent with early healing.

Two percutaneous fixation pins are seen across the fourth and fifth
metacarpals, however previously seen seen third pin is no longer
present.

No other fractures are identified.  No evidence of dislocation.
IMPRESSION: Early healing of comminuted fracture involving the base of the fifth
metacarpal, which remains in near anatomic alignment. Two
percutaneous fixation pins remain in place.
